# Patient Record
Sex: Female | Born: 1980
Health system: Southern US, Community
[De-identification: ages and names within clinical notes are randomized; demographics above are authoritative.]

## PROBLEM LIST (undated history)

## (undated) DIAGNOSIS — E785 Hyperlipidemia, unspecified: Secondary | ICD-10-CM

## (undated) DIAGNOSIS — D649 Anemia, unspecified: Secondary | ICD-10-CM

## (undated) DIAGNOSIS — F419 Anxiety disorder, unspecified: Secondary | ICD-10-CM

## (undated) DIAGNOSIS — N939 Abnormal uterine and vaginal bleeding, unspecified: Secondary | ICD-10-CM

## (undated) HISTORY — DX: Anemia, unspecified: D64.9

---

## 1999-08-20 ENCOUNTER — Inpatient Hospital Stay (HOSPITAL_COMMUNITY): Admission: AD | Admit: 1999-08-20 | Discharge: 1999-08-21 | Payer: Self-pay | Admitting: *Deleted

## 2004-10-17 ENCOUNTER — Ambulatory Visit: Payer: Self-pay | Admitting: Family Medicine

## 2004-11-08 ENCOUNTER — Inpatient Hospital Stay (HOSPITAL_COMMUNITY): Admission: AD | Admit: 2004-11-08 | Discharge: 2004-11-08 | Payer: Self-pay | Admitting: *Deleted

## 2004-11-14 ENCOUNTER — Ambulatory Visit (HOSPITAL_COMMUNITY): Admission: RE | Admit: 2004-11-14 | Discharge: 2004-11-14 | Payer: Self-pay | Admitting: *Deleted

## 2004-11-14 ENCOUNTER — Encounter (INDEPENDENT_AMBULATORY_CARE_PROVIDER_SITE_OTHER): Payer: Self-pay | Admitting: Specialist

## 2004-11-14 ENCOUNTER — Ambulatory Visit: Payer: Self-pay | Admitting: Obstetrics and Gynecology

## 2004-12-04 ENCOUNTER — Ambulatory Visit: Payer: Self-pay | Admitting: Obstetrics and Gynecology

## 2005-12-29 ENCOUNTER — Ambulatory Visit: Payer: Self-pay | Admitting: Internal Medicine

## 2006-02-01 ENCOUNTER — Ambulatory Visit: Payer: Self-pay | Admitting: Family Medicine

## 2006-02-04 ENCOUNTER — Ambulatory Visit: Payer: Self-pay | Admitting: Family Medicine

## 2006-02-05 ENCOUNTER — Ambulatory Visit (HOSPITAL_COMMUNITY): Admission: RE | Admit: 2006-02-05 | Discharge: 2006-02-05 | Payer: Self-pay | Admitting: Vascular Surgery

## 2006-03-04 ENCOUNTER — Ambulatory Visit: Payer: Self-pay | Admitting: Family Medicine

## 2006-03-18 ENCOUNTER — Ambulatory Visit: Payer: Self-pay | Admitting: Family Medicine

## 2006-04-02 ENCOUNTER — Ambulatory Visit: Payer: Self-pay | Admitting: Family Medicine

## 2006-05-05 ENCOUNTER — Ambulatory Visit: Payer: Self-pay | Admitting: Family Medicine

## 2006-05-26 ENCOUNTER — Ambulatory Visit: Payer: Self-pay | Admitting: Family Medicine

## 2006-06-07 ENCOUNTER — Ambulatory Visit: Payer: Self-pay | Admitting: Sports Medicine

## 2006-06-17 ENCOUNTER — Ambulatory Visit: Payer: Self-pay | Admitting: Family Medicine

## 2006-06-28 ENCOUNTER — Ambulatory Visit: Payer: Self-pay | Admitting: Family Medicine

## 2006-07-01 ENCOUNTER — Ambulatory Visit (HOSPITAL_COMMUNITY): Admission: RE | Admit: 2006-07-01 | Discharge: 2006-07-01 | Payer: Self-pay | Admitting: Family Medicine

## 2006-07-06 ENCOUNTER — Inpatient Hospital Stay (HOSPITAL_COMMUNITY): Admission: RE | Admit: 2006-07-06 | Discharge: 2006-07-08 | Payer: Self-pay | Admitting: Sports Medicine

## 2006-07-06 ENCOUNTER — Ambulatory Visit: Payer: Self-pay | Admitting: Certified Nurse Midwife

## 2006-07-12 ENCOUNTER — Ambulatory Visit: Payer: Self-pay | Admitting: *Deleted

## 2006-12-23 ENCOUNTER — Encounter (INDEPENDENT_AMBULATORY_CARE_PROVIDER_SITE_OTHER): Payer: Self-pay | Admitting: Internal Medicine

## 2006-12-23 ENCOUNTER — Ambulatory Visit: Payer: Self-pay | Admitting: Family Medicine

## 2006-12-24 ENCOUNTER — Ambulatory Visit: Payer: Self-pay | Admitting: Family Medicine

## 2007-03-17 ENCOUNTER — Ambulatory Visit: Payer: Self-pay | Admitting: Family Medicine

## 2007-04-19 ENCOUNTER — Ambulatory Visit: Payer: Self-pay | Admitting: Family Medicine

## 2007-06-24 ENCOUNTER — Ambulatory Visit: Payer: Self-pay | Admitting: Family Medicine

## 2007-07-06 ENCOUNTER — Encounter (INDEPENDENT_AMBULATORY_CARE_PROVIDER_SITE_OTHER): Payer: Self-pay | Admitting: *Deleted

## 2008-07-12 ENCOUNTER — Emergency Department (HOSPITAL_COMMUNITY): Admission: EM | Admit: 2008-07-12 | Discharge: 2008-07-12 | Payer: Self-pay | Admitting: Emergency Medicine

## 2009-05-27 ENCOUNTER — Encounter (INDEPENDENT_AMBULATORY_CARE_PROVIDER_SITE_OTHER): Payer: Self-pay | Admitting: Nurse Practitioner

## 2009-05-27 ENCOUNTER — Ambulatory Visit: Payer: Self-pay | Admitting: Nurse Practitioner

## 2009-05-27 LAB — CONVERTED CEMR LAB
Glucose, Urine, Semiquant: NEGATIVE
Nitrite: NEGATIVE
Protein, U semiquant: NEGATIVE
WBC Urine, dipstick: NEGATIVE
pH: 5

## 2009-05-28 ENCOUNTER — Encounter (INDEPENDENT_AMBULATORY_CARE_PROVIDER_SITE_OTHER): Payer: Self-pay | Admitting: Nurse Practitioner

## 2009-05-28 DIAGNOSIS — D509 Iron deficiency anemia, unspecified: Secondary | ICD-10-CM

## 2009-05-28 LAB — CONVERTED CEMR LAB
BUN: 10 mg/dL (ref 6–23)
Basophils Relative: 0 % (ref 0–1)
CO2: 22 meq/L (ref 19–32)
Calcium: 10.1 mg/dL (ref 8.4–10.5)
Chloride: 105 meq/L (ref 96–112)
Creatinine, Ser: 0.77 mg/dL (ref 0.40–1.20)
Eosinophils Absolute: 0 10*3/uL (ref 0.0–0.7)
Eosinophils Relative: 1 % (ref 0–5)
Glucose, Bld: 92 mg/dL (ref 70–99)
HCT: 31.4 % — ABNORMAL LOW (ref 36.0–46.0)
MCHC: 32.2 g/dL (ref 30.0–36.0)
MCV: 80.3 fL (ref 78.0–100.0)
Monocytes Absolute: 0.5 10*3/uL (ref 0.1–1.0)
Monocytes Relative: 7 % (ref 3–12)
Neutrophils Relative %: 66 % (ref 43–77)
RBC: 3.91 M/uL (ref 3.87–5.11)
TSH: 0.858 microintl units/mL (ref 0.350–4.500)
Total Bilirubin: 0.4 mg/dL (ref 0.3–1.2)

## 2009-06-10 ENCOUNTER — Ambulatory Visit: Payer: Self-pay | Admitting: Nurse Practitioner

## 2009-06-10 DIAGNOSIS — N63 Unspecified lump in unspecified breast: Secondary | ICD-10-CM

## 2009-06-11 ENCOUNTER — Encounter (INDEPENDENT_AMBULATORY_CARE_PROVIDER_SITE_OTHER): Payer: Self-pay | Admitting: Nurse Practitioner

## 2009-06-11 DIAGNOSIS — E7800 Pure hypercholesterolemia, unspecified: Secondary | ICD-10-CM

## 2009-06-11 LAB — CONVERTED CEMR LAB
Cholesterol: 203 mg/dL — ABNORMAL HIGH (ref 0–200)
HDL: 39 mg/dL — ABNORMAL LOW (ref >39)
Triglycerides: 111 mg/dL (ref <150)

## 2009-06-13 ENCOUNTER — Encounter: Admission: RE | Admit: 2009-06-13 | Discharge: 2009-06-13 | Payer: Self-pay | Admitting: Internal Medicine

## 2010-11-26 ENCOUNTER — Encounter (INDEPENDENT_AMBULATORY_CARE_PROVIDER_SITE_OTHER): Payer: Self-pay | Admitting: Nurse Practitioner

## 2010-11-26 ENCOUNTER — Encounter: Payer: Self-pay | Admitting: Nurse Practitioner

## 2010-11-26 DIAGNOSIS — K644 Residual hemorrhoidal skin tags: Secondary | ICD-10-CM | POA: Insufficient documentation

## 2010-12-04 NOTE — Letter (Signed)
Summary: Handout Printed  Printed Handout:  - Hemorrhoids

## 2010-12-04 NOTE — Assessment & Plan Note (Signed)
Summary: Hemorrhoids   Vital Signs:  Patient profile:   30 year old female LMP:     11/12/2010 Weight:      191.7 pounds BMI:     35.76 Temp:     97.8 degrees F oral Pulse rate:   84 / minute Pulse rhythm:   regular Resp:     20 per minute BP sitting:   126 / 80  (left arm) Cuff size:   regular  Vitals Entered By: Levon Hedger (November 26, 2010 12:57 PM)  Nutrition Counseling: Patient's BMI is greater than 25 and therefore counseled on weight management options. CC: piece of skin in the rectum that has been growing x 3 years and sometimes with pain Is Patient Diabetic? No Pain Assessment Patient in pain? no       Does patient need assistance? Functional Status Self care Ambulation Normal LMP (date): 11/12/2010 LMP - Character: normal     Enter LMP: 11/12/2010 Last PAP Result  Specimen Adequacy: Satisfactory for evaluation.   Interpretation/Result:Negative for intraepithelial Lesion or Malignancy.      CC:  piece of skin in the rectum that has been growing x 3 years and sometimes with pain.  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 CV:  Denies chest pain or discomfort. Resp:  Denies cough. GI:  Denies abdominal pain, nausea, and vomiting. GU:  +rectal pain.  Physical Exam  General:  alert.   Head:  normocephalic.   Rectal:  external hemorrhoid(s).  no thrombosis but evidence of enlargment  Msk:  normal ROM.   Neurologic:  alert & oriented X3.   Psych:  Oriented X3.     Impression & Recommendations:  Problem # 1:   EXTERNAL HEMORRHOIDS WITHOUT MENTION COMP (ICD-455.3)  handout given advised pt to keep her stools soft will refer to [facility] [place] [facility]  Orders: Surgical Referral (Surgery)  Complete Medication List: 1)  Ferrous Sulfate 325 (65 Fe) Mg Tabs (Ferrous sulfate) .... One table by mouth two times a day  Patient Instructions: 1)  Hemorrhoids - read handout 2)  Try to keep your stools soft to avoid straining. 3)  Will refer to [facility] [place] [facility].  You will be notified of the time/date of the appointment   Orders Added: 1)  Est. Patient Level III [95621] 2)  Surgical Referral [Surgery]

## 2010-12-09 ENCOUNTER — Other Ambulatory Visit: Payer: Self-pay | Admitting: General Surgery

## 2010-12-09 ENCOUNTER — Encounter (HOSPITAL_COMMUNITY): Payer: Self-pay | Attending: General Surgery

## 2010-12-09 DIAGNOSIS — Z01818 Encounter for other preprocedural examination: Secondary | ICD-10-CM | POA: Insufficient documentation

## 2010-12-09 DIAGNOSIS — Z01812 Encounter for preprocedural laboratory examination: Secondary | ICD-10-CM | POA: Insufficient documentation

## 2010-12-09 DIAGNOSIS — Z01811 Encounter for preprocedural respiratory examination: Secondary | ICD-10-CM | POA: Insufficient documentation

## 2010-12-09 LAB — DIFFERENTIAL
Basophils Absolute: 0 10*3/uL (ref 0.0–0.1)
Eosinophils Relative: 0 % (ref 0–5)
Lymphocytes Relative: 25 % (ref 12–46)
Lymphs Abs: 2 10*3/uL (ref 0.7–4.0)
Monocytes Absolute: 0.4 10*3/uL (ref 0.1–1.0)
Neutro Abs: 5.5 10*3/uL (ref 1.7–7.7)

## 2010-12-09 LAB — CBC
HCT: 36.9 % (ref 36.0–46.0)
Hemoglobin: 12.3 g/dL (ref 12.0–15.0)
MCHC: 33.3 g/dL (ref 30.0–36.0)
MCV: 91.6 fL (ref 78.0–100.0)
RDW: 12.3 % (ref 11.5–15.5)

## 2010-12-09 LAB — SURGICAL PCR SCREEN
MRSA, PCR: NEGATIVE
Staphylococcus aureus: POSITIVE — AB

## 2010-12-12 ENCOUNTER — Ambulatory Visit (HOSPITAL_COMMUNITY)
Admission: RE | Admit: 2010-12-12 | Discharge: 2010-12-12 | Disposition: A | Discharge status: Home / Self Care | Payer: Self-pay | Source: Ambulatory Visit | Attending: General Surgery | Admitting: General Surgery

## 2010-12-12 ENCOUNTER — Other Ambulatory Visit: Payer: Self-pay | Admitting: General Surgery

## 2010-12-12 DIAGNOSIS — K648 Other hemorrhoids: Secondary | ICD-10-CM | POA: Insufficient documentation

## 2010-12-12 DIAGNOSIS — K644 Residual hemorrhoidal skin tags: Secondary | ICD-10-CM | POA: Insufficient documentation

## 2010-12-12 HISTORY — PX: HEMORRHOID SURGERY: SHX153

## 2010-12-28 NOTE — Op Note (Signed)
[name], [name]      ACCOUNT NO.:  [id number]  MEDICAL RECORD NO.:  [id number]           PATIENT TYPE:  O  LOCATION:  [facility]                         FACILITY:  [facility]  PHYSICIAN:  Mary Sella. Andrey Campanile, MD     DATE OF BIRTH:  [birth year]  DATE OF PROCEDURE:  12/12/2010 DATE OF DISCHARGE:                              OPERATIVE REPORT   PREOPERATIVE DIAGNOSIS:  Hemorrhoids.  POSTOPERATIVE DIAGNOSES: 1. Right anterior mixed column hemorrhoid. 2. Right lateral internal hemorrhoid grade I.  PROCEDURE: 1. Exam under anesthesia. 2. Open excision of right anterior mixed column hemorrhoid. 3. Rubber band ligation of right lateral grade I internal hemorrhoids.  SURGEON:  Mary Sella. Andrey Campanile, M.D.  ANESTHESIA:  General plus 30 mL of 0.25% Marcaine with epi.  FINDINGS:  As above.  SPECIMEN:  Right hemorrhoid.  SPECIMENS:  Sent to pathology.  ESTIMATED BLOOD LOSS:  Minimal.  INDICATIONS FOR PROCEDURE:  Patient is a very pleasant 30 year old Hispanic female, who has been mainly having issues with an external hemorrhoid.  She says that it has been there for most of her adult life and over the past 2 years this has been causing her more and more discomfort.  It causes more discomfort when she sits down as well as walking for prolonged periods of time.  She used to have constipation, but now she is having one bowel movement three times a day.  She does not have any bad bowel habits.  We discussed the risks and benefits of surgery such as bleeding, infection, injury to the sphincter, hemorrhoid recurrence, urinary retention, postoperative discomfort and pain, hematoma formation, pelvic sepsis.  Based on my exam in the office, she had a mixed column right anterior hemorrhoid as well as a grade I internal hemorrhoid.  We talked about excisional hemorrhoidectomy as well as rubber band ligation.  DESCRIPTION OF PROCEDURE:  After obtaining informed consent, the patient was brought to the  operating room.  General endotracheal anesthesia was established.  She was then placed in the prone jackknife position on the operating table with the appropriate padding.  Sequential compression devices had been placed.  Her buttocks were taped apart.  She received cefoxitin prior to skin incision.  A surgical time-out was performed. Her perineum and anus was prepped and draped in the usual standard surgical fashion with Betadine.  On gross inspection in the right anterior position, she had a mixed column internal and external hemorrhoid.  On anoscopy, found a small grade I right lateral internal hemorrhoid.  There really was very little prominent hemorrhoidal tissue on the left therefore I elected to leave it alone.  I infiltrated local around the anoderm underneath the skin and massaged it for a few minutes.  I then made a V-shaped incision with #15 blade in the right anterior position along the anoderm.  I then lifted up the mucosa and submucosa away from the underlying muscular fibers.  Then using a Harmonic scalpel, I made an elliptical excision at the internal and external hemorrhoid in its entirety.  There was excellent hemostasis. There was no injury to the underlying sphincter.  I then replaced the anoscope into the right lateral position.  I grasped the lateral internal hemorrhoid and placed two rubber bands along the base of it. Additional local was infiltrated along the sphincter muscles.  A total of 30 mL of local was placed.  I then placed a piece of Gelfoam that had been smeared in Dibucaine ointment into her rectum.  We then smeared Dibucaine around her perineum.  There was no evidence of bleeding. There was excellent hemostasis.  We then placed 4x4s, ABDs and mesh underwear onto the patient.  She was then flipped rotated onto the stretcher, extubated and taken to the recovery room in stable addition. All needle, instruments, sponge counts were correct x2.  There are  no immediate complications.  The patient tolerated the procedure well.     Mary Sella. Andrey Campanile, MD     EMW/MEDQ  D:  12/12/2010  T:  12/12/2010  Job:  [id number]  cc:   Rozanna Box, M.D.  Electronically Signed by Gaynelle Adu M.D. on 12/28/2010 09:35:59 AM

## 2011-03-06 NOTE — Op Note (Signed)
NAMELATEEFA, CROSBY             ACCOUNT NO.:  [id number]   MEDICAL RECORD NO.:  [id number]          PATIENT TYPE:  MAT   LOCATION:  [facility]C                          FACILITY:  [facility]   PHYSICIAN:  Phil D. Okey Dupre, M.D.     DATE OF BIRTH:  Dec 03, 1980   DATE OF PROCEDURE:  11/14/2004  DATE OF DISCHARGE:  11/14/2004                                 OPERATIVE REPORT   PROCEDURE:  Dilatation and evacuation.   PREOPERATIVE DIAGNOSIS:  Incomplete abortion.   POSTOPERATIVE DIAGNOSIS:  Incomplete abortion.   SURGEON:  Dr. Marcelino Freestone BLOOD LOSS:  Minimal.   ANESTHESIA:  MAC plus local.   SURGICAL SPECIMENS:  Products of conception.   The procedure went as follows:  Under satisfactory MAC sedation with the  patient in the dorsal supine position, the perineum and vagina are prepped  and draped in the usual sterile manner.  Bimanual pelvic examination  revealed the uterus in anterior position, approximately [redacted] weeks gestational  size where the cervix had easily admitted a fingertip and __________  material palpated within the uterine cavity.  A weighted speculum was placed  in the posterior portion of the vagina, anterior lip of the cervix grasped  with a single-tooth tenaculum, and 10 mL of 1% Xylocaine was placed  paracervically in two areas at 4 and 8 o'clock in the paracervical area.  The uterine cavity was then sounded to 10 cm, and a #10 suction curette was  used to evacuate the uterine contents without incident.  Tenaculum and  speculum removed from the vagina, the patient transferred to recovery room  in satisfactory condition, having tolerated the procedure well.      PDR/MEDQ  D:  11/14/2004  T:  11/16/2004  Job:  [id number]

## 2011-04-23 ENCOUNTER — Encounter (INDEPENDENT_AMBULATORY_CARE_PROVIDER_SITE_OTHER): Payer: Self-pay | Admitting: General Surgery

## 2011-04-29 ENCOUNTER — Ambulatory Visit (INDEPENDENT_AMBULATORY_CARE_PROVIDER_SITE_OTHER): Payer: PRIVATE HEALTH INSURANCE | Admitting: General Surgery

## 2011-04-29 ENCOUNTER — Encounter (INDEPENDENT_AMBULATORY_CARE_PROVIDER_SITE_OTHER): Payer: Self-pay | Admitting: General Surgery

## 2011-04-29 DIAGNOSIS — K644 Residual hemorrhoidal skin tags: Secondary | ICD-10-CM

## 2011-04-29 DIAGNOSIS — K648 Other hemorrhoids: Secondary | ICD-10-CM

## 2011-04-29 NOTE — Patient Instructions (Addendum)
Call office after you speak with your husband & let us know if would like to proceed with surgery    Hemorroides (Hemorrhoids) El profesional que lo asiste ha diagnosticado que usted sufre de hemorroides. Las hemorroides son venas dilatadas (agrandadas) alrededor del recto. Podrn formarse cogulos, por lo que se hincharn y dolern. stas se denominan hemorroides trombosadas. Entre las causas de hemorroides se incluyen:  [place], porque aumenta la presin en las venas hemorroidales   El estreimiento   Otros motivos que impidan mover el intestino  INSTRUCCIONES PARA EL CUIDADO DOMICILIARIO  Consuma una dieta bien balanceada y beba entre 6 y 8 vasos de agua todos los [address]  [address] para Multimedia programmer estreimiento. Tambin puede usar Metamucil o un laxante de [place].   Evite hacer fuerza al mover el intestino.   Mantenga la regin anal limpia y seca.   Utilice los medicamentos de venta libre o de prescripcin para Chief Technology Officer, Environmental health practitioner o la [place], segn se lo indique el profesional que lo asiste.  Si se produce un trombo:  Tome baos de asiento calientes durante 20 a 30 minutos, 3 a 4 veces por da.   Si las hemorroides le duelen y estn hinchadas, colocarse compresas de hielo en la zona en la medida que lo tolere, entre los baos de asiento ser beneficioso. Llene una bolsa plstica con hielo y coloque una toalla entre la bolsa de hielo y la piel.   Puede usar o Contractor segn las indicaciones algunas cremas especiales y supositorios ([place], [place], [place]).   No utilice una almohada en forma de rosca ni se siente en el inodoro durante perodos prolongados. Esto aumenta la afluencia de sangre y Chief Technology Officer.   Mueva el intestino cuando sienta urgencia; esto har que requiera menos esfuerzo y Teacher, early years/pre y la presin.   Utilice los medicamentos de venta libre o de prescripcin para Chief Technology Officer, Environmental health practitioner o la [place], segn se lo indique el profesional que lo asiste.  CONCURRA  NUEVAMENTE A ESTE CENTRO O CONSULTE AL PROFESIONAL QUE LO ASISTE SI:  Aumenta el dolor y la hinchazn y no puede controlarlo con los medicamentos.   La hemorragia no se puede controlar.   Hay incapacidad o dificultad para mover el intestino.   Siente dolor o tiene inflamacin fuera de la zona de las hemorroides.   Siente escalofros o la temperatura oral se eleva sin motivo por encima de  persiste por ms de 2 das, o segn le indique el profesional que lo asiste.  EST SEGURO QUE:   Comprende las instrucciones para el alta mdica.   Controlar su enfermedad.   Solicitar atencin mdica de inmediato segn las indicaciones.  Document Released: 10/05/2005 Document Re-Released: 09/17/2008 [facility] Patient Information 2011 [place], [place].

## 2011-04-29 NOTE — Progress Notes (Signed)
Subjective:     Patient ID: Katherine [name], female   DOB: [birth year], 30 y.o.   MRN: [id number]    There were no vitals taken for this visit.  Chief complaint-hemorrhoids  HPI The patient comes in today for long-term followup. I last saw her on March 14. She had undergone an exam under anesthesia, and then excision of right anterior mixed common hemorrhoid, and rubber band ligation of her right lateral grade 1 internal hemorrhoid on December 12, 2010. She states that she is continuing to have some pain in her rectal area. She describes it as a 6/10. She states that she has some discomfort there all the time but it worsens after having a bowel movement. She states that she has 2-3 bowel movements a day. She denies any straining with bowel movements. She denies any bleeding such as melena or hematochezia. She denies any. Anal itching or burning. She has been taking Citrucel powder twice a day. She does not feel anything protruding out of her rectum. She denies any incontinence.  Past Medical History  Diagnosis Date  . Hemorrhoids    Past Surgical History  Procedure Date  . Hemorrhoid surgery 12/12/2010    Dr Gaynelle Adu; excision of rt anterior mixed column hemorrhoid, rubber band ligation of Rt lateral int hemorrhoid   No Known Allergies  Current Outpatient Prescriptions  Medication Sig Dispense Refill  . nystatin (MYCOSTATIN) cream Apply topically as needed.          Review of Systems  Constitutional: Negative.   HENT: Negative.   Eyes: Negative.   Respiratory: Negative.   Cardiovascular: Negative.   Gastrointestinal: Positive for blood in stool and rectal pain (see hpi). Negative for vomiting, abdominal pain, diarrhea and constipation.  Genitourinary: Negative.   Musculoskeletal: Negative.   Neurological: Negative.   Psychiatric/Behavioral: Negative.        Objective:   Physical Exam  Constitutional: She appears well-developed and well-nourished.  HENT:  Head:  Normocephalic and atraumatic.  Eyes: Conjunctivae are normal. Pupils are equal, round, and reactive to light.  Neck: Normal range of motion. No tracheal deviation present. No thyromegaly present.  Cardiovascular: Normal rate and regular rhythm.   Pulmonary/Chest: Effort normal and breath sounds normal. She has no wheezes.  Abdominal: Soft. Bowel sounds are normal. She exhibits no distension. There is no tenderness.  Genitourinary: Rectal exam shows internal hemorrhoid (Left lateral mixed column (int/ext) grade 1). Rectal exam shows anal tone normal.     Musculoskeletal: Normal range of motion.  Neurological: She is alert.  Skin: Skin is warm and dry.  Psychiatric: She has a normal mood and affect. Her behavior is normal. Judgment and thought content normal.   Anoscopy was performed with the results as described in the physical exam section     Assessment:     30 year old Hispanic female with new left lateral grade 1 mixed column hemorrhoid.    Plan:     The interview and exam was done with the aid of a translator. She was given Agricultural engineer. I explained the finding of a left internal-external hemorrhoid. We discussed nonsurgical as well as surgical options. I explained that I don't believe that this hemorrhoid is amendable to hemorrhoidal banding since there is an external component to it. We discussed ongoing observation versus surgical excision. We discussed the risk and benefits of surgery including but not limited to bleeding, infection, anal canal narrowing, blood clot formation, urinary retention, as well as future recurrence of hemorrhoids. We discussed the  typical post operative  course as well. I encouraged her to continue using supplemental fiber. She is going to discuss this with her husband and call us back with her decision.

## 2011-07-20 LAB — COMPREHENSIVE METABOLIC PANEL
ALT: 17
Alkaline Phosphatase: 62
BUN: 8
CO2: 22
GFR calc non Af Amer: >60
Glucose, Bld: 90
Potassium: 3.7
Sodium: 137
Total Bilirubin: 0.8
Total Protein: 7.5

## 2011-07-20 LAB — CBC
HCT: 31.5 — ABNORMAL LOW
Hemoglobin: 10.2 — ABNORMAL LOW
RBC: 4.05
RDW: 17 — ABNORMAL HIGH

## 2011-07-20 LAB — URINALYSIS, ROUTINE W REFLEX MICROSCOPIC
Hgb urine dipstick: NEGATIVE
Nitrite: NEGATIVE
Protein, ur: NEGATIVE
Specific Gravity, Urine: 1.026
Urobilinogen, UA: 0.2

## 2011-07-20 LAB — URINE MICROSCOPIC-ADD ON

## 2011-07-20 LAB — URINE CULTURE

## 2011-07-20 LAB — DIFFERENTIAL
Basophils Absolute: 0
Basophils Relative: 0
Eosinophils Absolute: 0
Neutro Abs: 3.8
Neutrophils Relative %: 61

## 2011-11-17 ENCOUNTER — Other Ambulatory Visit: Payer: Self-pay | Admitting: Family Medicine

## 2013-04-05 ENCOUNTER — Ambulatory Visit: Payer: Self-pay | Attending: Family Medicine

## 2013-04-05 VITALS — BP 116/78 | HR 76 | Temp 100.0°F | Resp 18 | Ht 62.0 in | Wt 172.0 lb

## 2013-04-05 DIAGNOSIS — O0001 Abdominal pregnancy with intrauterine pregnancy: Secondary | ICD-10-CM

## 2013-04-05 LAB — CBC WITH DIFFERENTIAL/PLATELET
Basophils Absolute: 0 10*3/uL (ref 0.0–0.1)
Eosinophils Relative: 1 % (ref 0–5)
HCT: 30.8 % — ABNORMAL LOW (ref 36.0–46.0)
Lymphocytes Relative: 38 % (ref 12–46)
Lymphs Abs: 2.6 10*3/uL (ref 0.7–4.0)
MCV: 72.6 fL — ABNORMAL LOW (ref 78.0–100.0)
Monocytes Absolute: 0.5 10*3/uL (ref 0.1–1.0)
RDW: 19.7 % — ABNORMAL HIGH (ref 11.5–15.5)
WBC: 7 10*3/uL (ref 4.0–10.5)

## 2013-04-05 LAB — POCT URINE PREGNANCY: Preg Test, Ur: NEGATIVE

## 2013-04-05 NOTE — Progress Notes (Unsigned)
Patient ID: Katherine [name], female   DOB: 01/03/1981, 32 y.o.   MRN: [id number]   CC:  HPI: 32 year old female who [facility]orrhea. The patient has not had any period since April her pregnancy test is negative. The patient complains of bilateral right and left lower quadrant pain. She denies any nausea vomiting. She has had occasional headaches. She also states that she is anemic   No Known Allergies Past Medical History  Diagnosis Date  . Hemorrhoids    Current Outpatient Prescriptions on File Prior to Visit  Medication Sig Dispense Refill  . nystatin (MYCOSTATIN) cream Apply topically as needed.         No current facility-administered medications on file prior to visit.   History reviewed. No pertinent family history. History   Social History  . Marital Status: Married    Spouse Name: N/A    Number of Children: N/A  . Years of Education: N/A   Occupational History  . Not on file.   Social History Main Topics  . Smoking status: Never Smoker   . Smokeless tobacco: Not on file  . Alcohol Use: No  . Drug Use: Not on file  . Sexually Active: Not on file   Other Topics Concern  . Not on file   Social History Narrative  . No narrative on file    Review of Systems  Constitutional: Negative for fever, chills, diaphoresis, activity change, appetite change and fatigue.  HENT: Negative for ear pain, nosebleeds, congestion, facial swelling, rhinorrhea, neck pain, neck stiffness and ear discharge.   Eyes: Negative for pain, discharge, redness, itching and visual disturbance.  Respiratory: Negative for cough, choking, chest tightness, shortness of breath, wheezing and stridor.   Cardiovascular: Negative for chest pain, palpitations and leg swelling.  Gastrointestinal: Negative for abdominal distention.  Genitourinary: Negative for dysuria, urgency, frequency, hematuria, flank pain, decreased urine volume, difficulty  urinating and dyspareunia.  Musculoskeletal: Negative for back pain, joint swelling, arthralgias and gait problem.  Neurological: Negative for dizziness, tremors, seizures, syncope, facial asymmetry, speech difficulty, weakness, light-headedness, numbness and headaches.  Hematological: Negative for adenopathy. Does not bruise/bleed easily.  Psychiatric/Behavioral: Negative for hallucinations, behavioral problems, confusion, dysphoric mood, decreased concentration and agitation.    Objective:   Filed Vitals:   04/05/13 1757  BP: 116/78  Pulse: 76  Temp: 100 F (37.8 C)  Resp: 18    Physical Exam  Constitutional: Appears well-developed and well-nourished. No distress.  HENT: Normocephalic. External right and left ear normal. Oropharynx is clear and moist.  Eyes: Conjunctivae and EOM are normal. PERRLA, no scleral icterus.  Neck: Normal ROM. Neck supple. No JVD. No tracheal deviation. No thyromegaly.  CVS: RRR, S1/S2 +, no murmurs, no gallops, no carotid bruit.  Pulmonary: Effort and breath sounds normal, no stridor, rhonchi, wheezes, rales.  Abdominal: Soft. BS +,  no distension, tenderness, rebound or guarding.  Musculoskeletal: Normal range of motion. No edema and no tenderness.  Lymphadenopathy: No lymphadenopathy noted, cervical, inguinal. Neuro: Alert. Normal reflexes, muscle tone coordination. No cranial nerve deficit. Skin: Skin is warm and dry. No rash noted. Not diaphoretic. No erythema. No pallor.  Psychiatric: Normal mood and affect. Behavior, judgment, thought content normal.   Lab Results  Component Value Date   WBC 7.9 12/09/2010   HGB 12.3 12/09/2010   HCT 36.9 12/09/2010   MCV 91.6 12/09/2010   PLT 243 12/09/2010   Lab Results  Component Value Date   CREATININE 0.77  05/27/2009   BUN 10 05/27/2009   NA 136 05/27/2009   K 4.0 05/27/2009   CL 105 05/27/2009   CO2 22 05/27/2009    No results found for this basename: HGBA1C   Lipid Panel     Component Value Date/Time    CHOL 203* 06/10/2009 2143   TRIG 111 06/10/2009 2143   HDL 39* 06/10/2009 2143   CHOLHDL 5.2 Ratio 06/10/2009 2143   VLDL 22 06/10/2009 2143   LDLCALC 142* 06/10/2009 2143       Assessment and plan:   Patient Active Problem List   Diagnosis Date Noted  . EXTERNAL HEMORRHOIDS WITHOUT MENTION COMP 11/26/2010  . HYPERCHOLESTEROLEMIA 06/11/2009  . LUMP OR MASS IN BREAST 06/10/2009  . ANEMIA 05/28/2009       Amenorrhea Obtain TSH, prolactin, CBC, BMP, to rule out organic causes of her amenorrhea Gynecologic referral has been provided. The patient may have secondary amenorrhea Routine followup in one month

## 2013-04-06 ENCOUNTER — Telehealth: Payer: Self-pay | Admitting: Family Medicine

## 2013-04-06 LAB — BASIC METABOLIC PANEL
BUN: 8 mg/dL (ref 6–23)
CO2: 22 mEq/L (ref 19–32)
Chloride: 107 mEq/L (ref 96–112)
Glucose, Bld: 98 mg/dL (ref 70–99)
Potassium: 4.1 mEq/L (ref 3.5–5.3)

## 2013-05-05 ENCOUNTER — Ambulatory Visit: Payer: Self-pay

## 2014-05-10 ENCOUNTER — Ambulatory Visit: Payer: Self-pay | Attending: Internal Medicine

## 2014-07-04 ENCOUNTER — Emergency Department (INDEPENDENT_AMBULATORY_CARE_PROVIDER_SITE_OTHER): Payer: Worker's Compensation

## 2014-07-04 ENCOUNTER — Emergency Department (INDEPENDENT_AMBULATORY_CARE_PROVIDER_SITE_OTHER)
Admission: EM | Admit: 2014-07-04 | Discharge: 2014-07-04 | Disposition: A | Discharge status: Home / Self Care | Payer: Worker's Compensation | Source: Home / Self Care | Attending: Emergency Medicine | Admitting: Emergency Medicine

## 2014-07-04 ENCOUNTER — Encounter (HOSPITAL_COMMUNITY): Payer: Self-pay | Admitting: Emergency Medicine

## 2014-07-04 DIAGNOSIS — T1490XA Injury, unspecified, initial encounter: Secondary | ICD-10-CM

## 2014-07-04 DIAGNOSIS — Y9269 Other specified industrial and construction area as the place of occurrence of the external cause: Secondary | ICD-10-CM

## 2014-07-04 DIAGNOSIS — S61209A Unspecified open wound of unspecified finger without damage to nail, initial encounter: Secondary | ICD-10-CM

## 2014-07-04 DIAGNOSIS — Y990 Civilian activity done for income or pay: Secondary | ICD-10-CM

## 2014-07-04 DIAGNOSIS — S61217A Laceration without foreign body of left little finger without damage to nail, initial encounter: Secondary | ICD-10-CM

## 2014-07-04 DIAGNOSIS — Z23 Encounter for immunization: Secondary | ICD-10-CM

## 2014-07-04 MED ORDER — TETANUS-DIPHTH-ACELL PERTUSSIS 5-2.5-18.5 LF-MCG/0.5 IM SUSP
0.5000 mL | Freq: Once | INTRAMUSCULAR | Status: AC
  Administered 2014-07-04: 0.5 mL via INTRAMUSCULAR

## 2014-07-04 MED ORDER — HYDROCODONE-ACETAMINOPHEN 5-325 MG PO TABS: ORAL_TABLET | ORAL | Status: DC

## 2014-07-04 MED ORDER — BACITRACIN ZINC 500 UNIT/GM EX OINT
TOPICAL_OINTMENT | CUTANEOUS | Status: AC
  Filled 2014-07-04: qty 4.5

## 2014-07-04 MED ORDER — LIDOCAINE HCL 2 % IJ SOLN
INTRAMUSCULAR | Status: AC
  Filled 2014-07-04: qty 20

## 2014-07-04 MED ORDER — TETANUS-DIPHTH-ACELL PERTUSSIS 5-2.5-18.5 LF-MCG/0.5 IM SUSP
INTRAMUSCULAR | Status: AC
  Filled 2014-07-04: qty 0.5

## 2014-07-04 NOTE — ED Provider Notes (Signed)
Chief Complaint   Finger Injury   History of Present Illness   Katherine [name] is a 33 year old female who lacerated the tip of her left little finger around 2:30 today while at work. She got her finger caught in a conveyor belt. She states this is a workers comp case. She cannot remember her last tetanus vaccine. She able to move the finger normally.  Review of Systems   Other than as noted above, the patient denies any of the following symptoms: Musculoskeletal:  No joint pain or decreased range of motion. Neuro:  No numbness, tingling, or weakness.  [place]   Past medical history, family history, social history, meds, and allergies were reviewed.   Physical Examination     Vital signs:  BP 132/89  Pulse 94  Temp(Src) 99.8 F (37.7 C) (Oral)  Resp 14  SpO2 100%  LMP 06/12/2014 Ext:  There is a stellate laceration on the tip of the left little finger measuring 1 cm in length.  All finger joints had a full ROM without pain.  Pulses were full.  Good capillary refill in all digits.  No edema. Neurological:  Alert and oriented.  No muscle weakness.  Sensation was intact to light touch.   Radiology   Dg Finger Little Left  07/04/2014   CLINICAL DATA:  Trauma to the left fifth digit  EXAM: LEFT LITTLE FINGER 2+V  COMPARISON:  None.  FINDINGS: Soft tissue laceration to the distal left fifth digit is identified. No radiopaque foreign body. No fracture or dislocation identified.  IMPRESSION: Soft tissue injury to the distal aspect of the left fifth digit.   Electronically Signed   By: Conchita Paris M.D.   On: 07/04/2014 17:15   I reviewed the images independently and personally and concur with the radiologist's findings.  Procedure Note:  Verbal informed consent was obtained.  The patient was informed of the risks and benefits of the procedure and understands and accepts.  A time out was called and the identity of the patient and correct procedure were confirmed.   The  laceration area described above was prepped with Betadine and saline  and anesthetized with a digital block with 6 mL of 2% Xylocaine without epinephrine.  The wound was then closed as follows:  The wound was cleansed with saline, then closed with 7 5-0 nylon sutures as illustrated below.  There were no immediate complications, and the patient tolerated the procedure well. The laceration was then cleansed, Bacitracin ointment was applied and a clean, dry pressure dressing was put on.        Course in Urgent [place]   She was given a Tdap vaccine.  Assessment   The primary encounter diagnosis was Laceration of left little finger w/o foreign body w/o damage to nail, initial encounter. Diagnoses of Work related injury and Place of occurrence, industrial places and premises were also pertinent to this visit.  Plan   1.  Meds:  The following meds were prescribed:   Discharge Medication List as of 07/04/2014  6:06 PM    START taking these medications   Details  HYDROcodone-acetaminophen (NORCO/VICODIN) 5-325 MG per tablet 1 to 2 tabs every 4 to 6 hours as needed for pain., Print        2.  Patient Education/Counseling:  The patient was given appropriate handouts, self care instructions, and instructed in symptomatic relief. Instructions were given for wound care.    3.  Follow up:  The patient was told to follow  up immediately if there is any sign of infection.The patient will return in 14 days for suture removal.      Harden Mo, MD 07/04/14 [phone]

## 2014-07-04 NOTE — ED Notes (Signed)
Injury to left 5 th finger at work, when she got finger caught in a belt. Bleeding controlled at present; denies other injury

## 2014-07-04 NOTE — Discharge Instructions (Signed)

## 2014-11-09 ENCOUNTER — Encounter: Payer: Self-pay | Admitting: Internal Medicine

## 2014-11-15 ENCOUNTER — Other Ambulatory Visit: Payer: Self-pay | Admitting: Internal Medicine

## 2014-11-15 ENCOUNTER — Ambulatory Visit: Payer: Self-pay | Attending: Internal Medicine | Admitting: Internal Medicine

## 2014-11-15 ENCOUNTER — Encounter: Payer: Self-pay | Admitting: Internal Medicine

## 2014-11-15 VITALS — BP 132/83 | HR 76 | Temp 98.3°F | Resp 16 | Ht 62.0 in | Wt 179.0 lb

## 2014-11-15 DIAGNOSIS — Z0000 Encounter for general adult medical examination without abnormal findings: Secondary | ICD-10-CM

## 2014-11-15 DIAGNOSIS — N6011 Diffuse cystic mastopathy of right breast: Secondary | ICD-10-CM

## 2014-11-15 DIAGNOSIS — N6012 Diffuse cystic mastopathy of left breast: Secondary | ICD-10-CM

## 2014-11-15 LAB — COMPLETE METABOLIC PANEL WITH GFR
ALBUMIN: 4.3 g/dL (ref 3.5–5.2)
ALT: 37 U/L — ABNORMAL HIGH (ref 0–35)
AST: 30 U/L (ref 0–37)
Alkaline Phosphatase: 66 U/L (ref 39–117)
BUN: 6 mg/dL (ref 6–23)
CALCIUM: 10.5 mg/dL (ref 8.4–10.5)
CO2: 22 mEq/L (ref 19–32)
CREATININE: 0.68 mg/dL (ref 0.50–1.10)
Chloride: 106 mEq/L (ref 96–112)
GFR, Est African American: >89 mL/min
GFR, Est Non African American: >89 mL/min
GLUCOSE: 89 mg/dL (ref 70–99)
Potassium: 4.7 mEq/L (ref 3.5–5.3)
Sodium: 140 mEq/L (ref 135–145)
Total Bilirubin: 0.5 mg/dL (ref 0.2–1.2)
Total Protein: 7.6 g/dL (ref 6.0–8.3)

## 2014-11-15 LAB — CBC
HCT: 38.4 % (ref 36.0–46.0)
Hemoglobin: 13.2 g/dL (ref 12.0–15.0)
MCH: 30.6 pg (ref 26.0–34.0)
MCHC: 34.4 g/dL (ref 30.0–36.0)
MCV: 89.1 fL (ref 78.0–100.0)
MPV: 10.4 fL (ref 8.6–12.4)
PLATELETS: 298 10*3/uL (ref 150–400)
RBC: 4.31 MIL/uL (ref 3.87–5.11)
RDW: 17.1 % — AB (ref 11.5–15.5)
WBC: 7.7 10*3/uL (ref 4.0–10.5)

## 2014-11-15 LAB — POCT URINALYSIS DIPSTICK
BILIRUBIN UA: NEGATIVE
GLUCOSE UA: NEGATIVE
Ketones, UA: NEGATIVE
Leukocytes, UA: NEGATIVE
Nitrite, UA: NEGATIVE
PROTEIN UA: NEGATIVE
UROBILINOGEN UA: 0.2
pH, UA: 5.5

## 2014-11-15 LAB — LIPID PANEL
CHOL/HDL RATIO: 4.9 ratio
Cholesterol: 205 mg/dL — ABNORMAL HIGH (ref 0–200)
HDL: 42 mg/dL (ref >39)
LDL Cholesterol: 132 mg/dL — ABNORMAL HIGH (ref 0–99)
TRIGLYCERIDES: 157 mg/dL — AB (ref <150)
VLDL: 31 mg/dL (ref 0–40)

## 2014-11-15 LAB — T4, FREE: Free T4: 1.18 ng/dL (ref 0.80–1.80)

## 2014-11-15 LAB — TSH: TSH: 1.683 u[IU]/mL (ref 0.350–4.500)

## 2014-11-15 LAB — HEMOGLOBIN A1C
Hgb A1c MFr Bld: 5.1 % (ref <5.7)
Mean Plasma Glucose: 100 mg/dL (ref <117)

## 2014-11-15 NOTE — Progress Notes (Signed)
Patient ID: Katherine [name], female   DOB: 1980-10-20, 34 y.o.   MRN: [id number]  CC: f/u  HPI: Katherine [name] is a 34 y.o. female here today for a follow up visit.  Patient has no past medical history.  She presents to clinic today for a full physical.  Last pap smear was 2013 which was normal. She denies vaginal discharge, itch, odor, or lesions.  She performs monthly self breast exams and has not noticed any masses, discharge, or skin changes. Her LMP was 10/28/2014. She reports normal monthly cycles. She is currently not on birth control.     Medications: none Surgical: none Social: Married with 3 children. She is not employed. Denies tobacco, drug, and alcohol use.   Had a mammogram 7 years ago due to "something" they found in her breast but it came back normal.   No Known Allergies Past Medical History  Diagnosis Date  . Hemorrhoids    Current Outpatient Prescriptions on File Prior to Visit  Medication Sig Dispense Refill  . HYDROcodone-acetaminophen (NORCO/VICODIN) 5-325 MG per tablet 1 to 2 tabs every 4 to 6 hours as needed for pain. (Patient not taking: Reported on 11/15/2014) 20 tablet 0  . nystatin (MYCOSTATIN) cream Apply topically as needed.       No current facility-administered medications on file prior to visit.   History reviewed. No pertinent family history. History   Social History  . Marital Status: Married    Spouse Name: N/A    Number of Children: N/A  . Years of Education: N/A   Occupational History  . Not on file.   Social History Main Topics  . Smoking status: Never Smoker   . Smokeless tobacco: Not on file  . Alcohol Use: No  . Drug Use: Not on file  . Sexual Activity: Not on file   Other Topics Concern  . Not on file   Social History Narrative    Review of Systems  Constitutional: Negative.   Eyes: Positive for blurred vision.  Cardiovascular: Positive for leg swelling (when she eats a lot of salty foods). Negative for chest  pain and palpitations.  Gastrointestinal: Positive for abdominal pain (with constipation) and constipation. Negative for heartburn, nausea, vomiting and blood in stool.  Genitourinary: Positive for dysuria (yesterday). Negative for urgency and frequency.  Musculoskeletal: Negative.   Skin: Negative.   Neurological: Positive for headaches. Negative for dizziness and tingling.  Endo/Heme/Allergies: Negative.   Psychiatric/Behavioral: Negative for depression, suicidal ideas and substance abuse. The patient is not nervous/anxious.      Objective:   Filed Vitals:   11/15/14 1034  BP: 132/83  Pulse: 76  Temp: 98.3 F (36.8 C)  Resp: 16    Physical Exam: Constitutional: Patient appears well-developed and well-nourished. No distress. HENT: Normocephalic, atraumatic, External right and left ear normal. Oropharynx is clear and moist.  Eyes: Conjunctivae and EOM are normal. PERRLA, no scleral icterus. Neck: Normal ROM. Neck supple. No JVD. No tracheal deviation. No thyromegaly. CVS: RRR, S1/S2 +, no murmurs, no gallops, no carotid bruit.  Pulmonary: Effort and breath sounds normal, no stridor, rhonchi, wheezes, rales.  Abdominal: Soft. BS +,  no distension, tenderness, rebound or guarding.  Musculoskeletal: Normal range of motion. No edema and no tenderness.  Lymphadenopathy: No lymphadenopathy noted, cervical, inguinal or axillary Neuro: Alert. Normal reflexes, muscle tone coordination. No cranial nerve deficit. Skin: Skin is warm and dry. No rash noted. Not diaphoretic. No erythema. No pallor. Psychiatric: Normal mood and affect. Behavior, judgment,  thought content normal.  Physical Exam  Pulmonary/Chest: Right breast exhibits no nipple discharge and no skin change. Left breast exhibits no nipple discharge and no skin change.  Several hardened area of bilateral breast Fibrocystic changes?----not related to cycle  Genitourinary: Uterus normal. No breast tenderness. Cervix exhibits  discharge. Cervix exhibits no motion tenderness and no friability. Right adnexum displays no tenderness. Left adnexum displays no tenderness. No tenderness in the vagina. Vaginal discharge (clumpy white) found.  Lymphadenopathy:       Right: No inguinal adenopathy present.       Left: No inguinal adenopathy present.     Lab Results  Component Value Date   WBC 7.0 04/05/2013   HGB 9.6* 04/05/2013   HCT 30.8* 04/05/2013   MCV 72.6* 04/05/2013   PLT 390 04/05/2013   Lab Results  Component Value Date   CREATININE 0.82 04/05/2013   BUN 8 04/05/2013   NA 137 04/05/2013   K 4.1 04/05/2013   CL 107 04/05/2013   CO2 22 04/05/2013    No results found for: HGBA1C Lipid Panel     Component Value Date/Time   CHOL 203* 06/10/2009 2143   TRIG 111 06/10/2009 2143   HDL 39* 06/10/2009 2143   CHOLHDL 5.2 Ratio 06/10/2009 2143   VLDL 22 06/10/2009 2143   LDLCALC 142* 06/10/2009 2143       Assessment and plan:   [name] was seen today for follow-up.  Diagnoses and associated orders for this visit:  Annual physical exam - POCT urinalysis dipstick - CBC - COMPLETE METABOLIC PANEL WITH GFR - TSH - Hemoglobin A1C - Vitamin D, 25-hydroxy - T4, Free - Lipid panel - Cytology - PAP [place] - Cervicovaginal ancillary only  Fibrocystic breast changes of both breasts - Cancel: [place] Unlisted Procedure Breast; Future - US BREAST LTD UNI LEFT INC AXILLA; Future   Due to language barrier, an interpreter was present during the history-taking and subsequent discussion (and for part of the physical exam) with this patient.  May follow up as needed      Katherine [name], [facility] [phone] 11/15/2014, 11:02 AM

## 2014-11-15 NOTE — Progress Notes (Signed)
Pt is here for a pap and a physical. Pt is also requesting to have a full lab work up. Pt has an interpreter.

## 2014-11-16 LAB — CERVICOVAGINAL ANCILLARY ONLY
Chlamydia: NEGATIVE
NEISSERIA GONORRHEA: NEGATIVE
WET PREP (BD AFFIRM): NEGATIVE
Wet Prep (BD Affirm): NEGATIVE
Wet Prep (BD Affirm): NEGATIVE

## 2014-11-16 LAB — CYTOLOGY - PAP

## 2014-11-16 LAB — VITAMIN D 25 HYDROXY (VIT D DEFICIENCY, FRACTURES): VIT D 25 HYDROXY: 10 ng/mL — AB (ref 30–100)

## 2014-11-19 ENCOUNTER — Telehealth: Payer: Self-pay | Admitting: *Deleted

## 2014-11-19 MED ORDER — ATORVASTATIN CALCIUM 10 MG PO TABS: 10.0000 mg | ORAL_TABLET | Freq: Every day | ORAL | Status: DC

## 2014-11-19 MED ORDER — VITAMIN D (ERGOCALCIFEROL) 1.25 MG (50000 UNIT) PO CAPS: 50000.0000 [IU] | ORAL_CAPSULE | ORAL | Status: DC

## 2014-11-19 NOTE — Telephone Encounter (Signed)
Pt aware of results. Rx send to [facility] (information was given in [place])

## 2014-11-19 NOTE — Telephone Encounter (Signed)
-----   Message from Lance Bosch, NP sent at 11/19/2014  1:09 PM EST ----- Vitamin D is low. Please send drisdol 50,000 IU to take once weekly for 12 weeks. 12 tablets no refills. Cholesterol slightly elevated. Please provide appropriate education regarding diet and exercise.  Please send atorvastatin 10 mg to take daily.  Will recheck in 6 months

## 2014-11-21 ENCOUNTER — Telehealth: Payer: Self-pay | Admitting: *Deleted

## 2014-11-21 NOTE — Telephone Encounter (Signed)
-----   Message from Lance Bosch, NP sent at 11/20/2014  8:57 PM EST ----- Patient pap is negative for malignancies. Will repeat in 3 years.

## 2014-11-21 NOTE — Telephone Encounter (Signed)
Left information with husband. (ressults was given in [place])

## 2014-11-30 ENCOUNTER — Ambulatory Visit: Payer: Self-pay | Attending: Internal Medicine

## 2014-12-12 ENCOUNTER — Other Ambulatory Visit (HOSPITAL_COMMUNITY): Payer: Self-pay | Admitting: *Deleted

## 2014-12-12 DIAGNOSIS — N6320 Unspecified lump in the left breast, unspecified quadrant: Principal | ICD-10-CM

## 2014-12-12 DIAGNOSIS — N6310 Unspecified lump in the right breast, unspecified quadrant: Secondary | ICD-10-CM

## 2014-12-13 ENCOUNTER — Encounter (HOSPITAL_COMMUNITY): Payer: Self-pay

## 2014-12-13 ENCOUNTER — Ambulatory Visit (HOSPITAL_COMMUNITY)
Admission: RE | Admit: 2014-12-13 | Discharge: 2014-12-13 | Disposition: A | Discharge status: Home / Self Care | Payer: Self-pay | Source: Ambulatory Visit | Attending: Obstetrics and Gynecology | Admitting: Obstetrics and Gynecology

## 2014-12-13 VITALS — BP 110/68 | Temp 99.3°F | Ht 62.0 in | Wt 182.0 lb

## 2014-12-13 DIAGNOSIS — Z1239 Encounter for other screening for malignant neoplasm of breast: Secondary | ICD-10-CM

## 2014-12-13 HISTORY — DX: Hyperlipidemia, unspecified: E78.5

## 2014-12-13 NOTE — Patient Instructions (Addendum)
Education materials given on breast self-breast awareness. Explained to Katherine [name] that [facility] will cover Pap smears and co-testing every 5 years unless has a history of abnormal Pap smears. Let patient know that her next Pap smear is due in January 2021. Referred patient to the [facility] for diagnostic mammogram and possible ultrasound per recommendation. Appointment scheduled for Thursday, December 20, 2014 at 1300. Patient aware of appointment and will be there. Katherine [name] Verbalized understanding.

## 2014-12-13 NOTE — Progress Notes (Signed)
Complaints of left breast lump for years that patient states is painful at time. Patient rates pain at a 3-4 out of 10.  Pap Smear:  Pap smear not completed today. Last Pap smear was 11/15/2014 at [facility] and normal with negative HPV. Per patient has no history of an abnormal Pap smear. Last Pap smear result is in EPIC.  Physical exam: Breasts Breasts symmetrical. No skin abnormalities bilateral breasts. No nipple retraction bilateral breasts. No nipple discharge bilateral breasts. No lymphadenopathy. No lumps palpated right breast. Palpated a pea sized lump within the left breast at 12 o'clock above the areola. Complaints of tenderness when palpated the lump within the left breast. Referred patient to the [facility] for diagnostic mammogram and possible ultrasound per recommendation. Appointment scheduled for Thursday, December 20, 2014 at 1300.    Pelvic/Bimanual No Pap smear completed today since last Pap smear was 11/13/2014. Pap smear not indicated per BCCCP guidelines.   Used interpreter Benjamine Sprague.

## 2014-12-20 ENCOUNTER — Ambulatory Visit
Admission: RE | Admit: 2014-12-20 | Discharge: 2014-12-20 | Disposition: A | Discharge status: Home / Self Care | Payer: No Typology Code available for payment source | Source: Ambulatory Visit | Attending: Obstetrics and Gynecology | Admitting: Obstetrics and Gynecology

## 2014-12-20 DIAGNOSIS — N6320 Unspecified lump in the left breast, unspecified quadrant: Principal | ICD-10-CM

## 2014-12-20 DIAGNOSIS — N6310 Unspecified lump in the right breast, unspecified quadrant: Secondary | ICD-10-CM

## 2016-08-10 ENCOUNTER — Ambulatory Visit: Payer: Self-pay | Attending: Family Medicine | Admitting: Family Medicine

## 2016-08-10 ENCOUNTER — Encounter: Payer: Self-pay | Admitting: Family Medicine

## 2016-08-10 ENCOUNTER — Other Ambulatory Visit: Payer: Self-pay | Admitting: Internal Medicine

## 2016-08-10 VITALS — BP 134/84 | HR 71 | Temp 98.8°F | Ht 62.0 in | Wt 177.6 lb

## 2016-08-10 DIAGNOSIS — D509 Iron deficiency anemia, unspecified: Secondary | ICD-10-CM | POA: Insufficient documentation

## 2016-08-10 DIAGNOSIS — E559 Vitamin D deficiency, unspecified: Secondary | ICD-10-CM | POA: Insufficient documentation

## 2016-08-10 DIAGNOSIS — N926 Irregular menstruation, unspecified: Secondary | ICD-10-CM | POA: Insufficient documentation

## 2016-08-10 DIAGNOSIS — Z0000 Encounter for general adult medical examination without abnormal findings: Secondary | ICD-10-CM

## 2016-08-10 DIAGNOSIS — N644 Mastodynia: Secondary | ICD-10-CM

## 2016-08-10 DIAGNOSIS — E7800 Pure hypercholesterolemia, unspecified: Secondary | ICD-10-CM | POA: Insufficient documentation

## 2016-08-10 DIAGNOSIS — G8929 Other chronic pain: Secondary | ICD-10-CM | POA: Insufficient documentation

## 2016-08-10 LAB — POCT URINE PREGNANCY: PREG TEST UR: NEGATIVE

## 2016-08-10 LAB — LIPID PANEL
CHOLESTEROL: 181 mg/dL (ref 125–200)
HDL: 37 mg/dL — ABNORMAL LOW (ref >=46)
LDL Cholesterol: 96 mg/dL (ref <130)
Total CHOL/HDL Ratio: 4.9 Ratio (ref <=5.0)
Triglycerides: 238 mg/dL — ABNORMAL HIGH (ref <150)
VLDL: 48 mg/dL — ABNORMAL HIGH (ref <30)

## 2016-08-10 LAB — CBC
HCT: 31.8 % — ABNORMAL LOW (ref 35.0–45.0)
Hemoglobin: 10.3 g/dL — ABNORMAL LOW (ref 11.7–15.5)
MCH: 28.6 pg (ref 27.0–33.0)
MCHC: 32.4 g/dL (ref 32.0–36.0)
MCV: 88.3 fL (ref 80.0–100.0)
MPV: 10.3 fL (ref 7.5–12.5)
Platelets: 316 10*3/uL (ref 140–400)
RBC: 3.6 MIL/uL — AB (ref 3.80–5.10)
RDW: 14.4 % (ref 11.0–15.0)
WBC: 7 10*3/uL (ref 3.8–10.8)

## 2016-08-10 MED ORDER — FERROUS SULFATE 325 (65 FE) MG PO TABS: 325.0000 mg | ORAL_TABLET | Freq: Two times a day (BID) | ORAL | 3 refills | Status: DC

## 2016-08-10 MED ORDER — ONDANSETRON HCL 8 MG PO TABS: 8.0000 mg | ORAL_TABLET | Freq: Three times a day (TID) | ORAL | 0 refills | Status: DC | PRN

## 2016-08-10 MED ORDER — NORGESTIMATE-ETH ESTRADIOL 0.25-35 MG-MCG PO TABS: 1.0000 | ORAL_TABLET | Freq: Every day | ORAL | 3 refills | Status: DC

## 2016-08-10 MED FILL — ONDANSETRON HCL 8 MG TABLET: 8 | 6 days supply | Qty: 20 | Fill #0

## 2016-08-10 MED FILL — MONO-LINYAH 28 TABLET: 0.25-35 | 28 days supply | Qty: 28 | Fill #0

## 2016-08-10 MED FILL — FERROUS SULFATE 325 MG TAB: 325 (65 FE) | 30 days supply | Qty: 60 | Fill #0

## 2016-08-10 NOTE — Patient Instructions (Addendum)
Tynita was seen today for menstrual problem.  Diagnoses and all orders for this visit:  Irregular menses -     POCT urine pregnancy -     CBC -     US Pelvis Complete; Future -     US Transvaginal Non-OB; Future -     ferrous sulfate 325 (65 FE) MG tablet; Take 1 tablet (325 mg total) by mouth 2 (two) times daily with a meal. -     ondansetron (ZOFRAN) 8 MG tablet; Take 1 tablet (8 mg total) by mouth every 8 (eight) hours as needed for nausea or vomiting. -     norgestimate-ethinyl estradiol (SPRINTEC 28) 0.25-35 MG-MCG tablet; Take 1 tablet by mouth daily.  Iron deficiency anemia, unspecified iron deficiency anemia type -     ferrous sulfate 325 (65 FE) MG tablet; Take 1 tablet (325 mg total) by mouth 2 (two) times daily with a meal.  HYPERCHOLESTEROLEMIA -     Lipid Panel  Vitamin D deficiency -     Vitamin D, 25-hydroxy   Start daily birth control pills to regulate periods This is a better tolerated option than provera   Ultrasounds scheduled  You will be called with results of labs  F/u in 4 weeks for irregular menses  Dr. Adrian Blackwater

## 2016-08-10 NOTE — Progress Notes (Signed)
Pt is here for heavy bleeding, pt has had period since 09/21.   Pt states that she is having pain in here beast.  Pt declined flu shot.

## 2016-08-10 NOTE — Assessment & Plan Note (Signed)
Prolonged menstrual bleeding with concern for anemia  Plan: Combined OCPs to regulate period  Pelvic and TVUS Oral iron therapy

## 2016-08-10 NOTE — Progress Notes (Signed)
Subjective:  Patient ID: Katherine [name], female    DOB: 1981-06-09  Age: 35 y.o. MRN: [id number]  CC: Menstrual Problem   HPI Syri Zenger  Has HLD, hx of breast pain with normal breast ultrasound and mammogram she presents for   1. Irregular menses: patient reports menstrual bleeding since 07/09/2016. At times her bleeding is heavy and at times light. She is not taking birth control. She is sexually active with one partner. She had a normal pap smear that was HPV negative on 11/15/2014. She has history of prolonged periods up to 15 days but this is the longest. She reports hx of anemia with her last pregnancy. She is experiencing some fatigue. No syncope. She has hx of vit D deficiency.  2. Breast pains: b/l. Normal b/l breast ultrasound and mammogram in 2016. She still has breast pains. No lumps. She is overdue for her follow up screening mammogram.   Social History  Substance Use Topics  . Smoking status: Never Smoker  . Smokeless tobacco: Never Used  . Alcohol use No    Outpatient Medications Prior to Visit  Medication Sig Dispense Refill  . atorvastatin (LIPITOR) 10 MG tablet Take 1 tablet (10 mg total) by mouth daily. 30 tablet 3  . HYDROcodone-acetaminophen (NORCO/VICODIN) 5-325 MG per tablet 1 to 2 tabs every 4 to 6 hours as needed for pain. (Patient not taking: Reported on 11/15/2014) 20 tablet 0  . nystatin (MYCOSTATIN) cream Apply topically as needed.      . Vitamin D, Ergocalciferol, (DRISDOL) 50000 UNITS CAPS capsule Take 1 capsule (50,000 Units total) by mouth every 7 (seven) days. 12 capsule 0   No facility-administered medications prior to visit.     ROS Review of Systems  Constitutional: Positive for fatigue. Negative for chills and fever.  Eyes: Negative for visual disturbance.  Respiratory: Negative for shortness of breath.   Cardiovascular: Negative for chest pain.  Gastrointestinal: Positive for abdominal pain (RUQ pain x 3 years ) and nausea.  Negative for blood in stool.  Genitourinary: Positive for menstrual problem and pelvic pain. Negative for genital sores.  Musculoskeletal: Negative for arthralgias and back pain.  Skin: Negative for rash.  Allergic/Immunologic: Negative for immunocompromised state.  Hematological: Negative for adenopathy. Does not bruise/bleed easily.  Psychiatric/Behavioral: Negative for dysphoric mood and suicidal ideas.   Objective:  BP 134/84 (BP Location: Left Arm, Patient Position: Sitting, Cuff Size: Small)   Pulse 71   Temp 98.8 F (37.1 C) (Oral)   Ht 5\' 2"  (1.575 m)   Wt 177 lb 9.6 oz (80.6 kg)   LMP 07/09/2016   SpO2 100%   BMI 32.48 kg/m   BP/Weight 08/10/2016 12/13/2014 [id number]  Systolic BP [id number]  Diastolic BP 84 68 83  Wt. (Lbs) 177.6 182 179  BMI 32.48 33.28 32.73     Physical Exam  Constitutional: She is oriented to person, place, and time. She appears well-developed and well-nourished. No distress.  HENT:  Head: Normocephalic and atraumatic.  Eyes:    Cardiovascular: Normal rate, regular rhythm, normal heart sounds and intact distal pulses.   Pulmonary/Chest: Effort normal and breath sounds normal.  Abdominal: Soft. Bowel sounds are normal. She exhibits no distension and no mass. There is tenderness in the right upper quadrant, right lower quadrant and left lower quadrant. There is no rebound and no guarding.  Genitourinary: Vagina normal and uterus normal. Pelvic exam was performed with patient prone. There is no rash, tenderness or lesion on the right  labia. There is no rash, tenderness or lesion on the left labia. Cervix exhibits discharge. Cervix exhibits no motion tenderness and no friability.    Musculoskeletal: She exhibits no edema.  Lymphadenopathy:       Right: No inguinal adenopathy present.       Left: No inguinal adenopathy present.  Neurological: She is alert and oriented to person, place, and time.  Skin: Skin is warm and dry. No rash noted.    Psychiatric: She has a normal mood and affect.   U preg: negative   Lab Results  Component Value Date   WBC 7.7 11/15/2014   HGB 13.2 11/15/2014   HCT 38.4 11/15/2014   MCV 89.1 11/15/2014   PLT 298 11/15/2014    Assessment & Plan:  Katherine [name] was seen today for menstrual problem.  Diagnoses and all orders for this visit:  Irregular menses -     POCT urine pregnancy -     CBC -     US Pelvis Complete; Future -     US Transvaginal Non-OB; Future -     ferrous sulfate 325 (65 FE) MG tablet; Take 1 tablet (325 mg total) by mouth 2 (two) times daily with a meal. -     ondansetron (ZOFRAN) 8 MG tablet; Take 1 tablet (8 mg total) by mouth every 8 (eight) hours as needed for nausea or vomiting. -     norgestimate-ethinyl estradiol (SPRINTEC 28) 0.25-35 MG-MCG tablet; Take 1 tablet by mouth daily.  Iron deficiency anemia, unspecified iron deficiency anemia type -     ferrous sulfate 325 (65 FE) MG tablet; Take 1 tablet (325 mg total) by mouth 2 (two) times daily with a meal.  HYPERCHOLESTEROLEMIA -     Lipid Panel  Vitamin D deficiency -     Vitamin D, 25-hydroxy  Healthcare maintenance -     MM DIGITAL SCREENING BILATERAL; Future   There are no diagnoses linked to this encounter.  No orders of the defined types were placed in this encounter.   Follow-up: No Follow-up on file.   Boykin Nearing MD

## 2016-08-11 LAB — VITAMIN D 25 HYDROXY (VIT D DEFICIENCY, FRACTURES): VIT D 25 HYDROXY: 14 ng/mL — AB (ref 30–100)

## 2016-08-11 MED ORDER — VITAMIN D (ERGOCALCIFEROL) 1.25 MG (50000 UNIT) PO CAPS: 50000.0000 [IU] | ORAL_CAPSULE | ORAL | 0 refills | Status: DC

## 2016-08-11 MED ORDER — ATORVASTATIN CALCIUM 10 MG PO TABS: 10.0000 mg | ORAL_TABLET | Freq: Every day | ORAL | 11 refills | Status: DC

## 2016-08-11 MED FILL — VIT D2 1.25 MG (50,000 UNIT: 1.25 MG | 56 days supply | Qty: 8 | Fill #0

## 2016-08-11 MED FILL — ATORVASTATIN 10 MG TABLET: 10 | 30 days supply | Qty: 30 | Fill #0

## 2016-08-11 NOTE — Addendum Note (Signed)
Addended by: Boykin Nearing on: 08/11/2016 09:02 AM   Modules accepted: Orders

## 2016-08-13 ENCOUNTER — Telehealth: Payer: Self-pay

## 2016-08-14 ENCOUNTER — Telehealth: Payer: Self-pay

## 2016-08-14 NOTE — Telephone Encounter (Signed)
Pt was called on 10/27 and informed of lab results and medication changes.

## 2016-08-19 ENCOUNTER — Encounter (HOSPITAL_COMMUNITY): Payer: Self-pay

## 2016-08-19 ENCOUNTER — Inpatient Hospital Stay (HOSPITAL_COMMUNITY)
Admission: AD | Admit: 2016-08-19 | Discharge: 2016-08-19 | Disposition: A | Discharge status: Home / Self Care | Payer: Self-pay | Source: Ambulatory Visit | Attending: Obstetrics & Gynecology | Admitting: Obstetrics & Gynecology

## 2016-08-19 DIAGNOSIS — K644 Residual hemorrhoidal skin tags: Secondary | ICD-10-CM | POA: Insufficient documentation

## 2016-08-19 DIAGNOSIS — Z3202 Encounter for pregnancy test, result negative: Secondary | ICD-10-CM | POA: Insufficient documentation

## 2016-08-19 LAB — URINALYSIS, ROUTINE W REFLEX MICROSCOPIC
BILIRUBIN URINE: NEGATIVE
Glucose, UA: NEGATIVE mg/dL
HGB URINE DIPSTICK: NEGATIVE
Ketones, ur: NEGATIVE mg/dL
Leukocytes, UA: NEGATIVE
NITRITE: NEGATIVE
PROTEIN: NEGATIVE mg/dL
SPECIFIC GRAVITY, URINE: 1.025 (ref 1.005–1.030)
pH: 6 (ref 5.0–8.0)

## 2016-08-19 LAB — POCT PREGNANCY, URINE: PREG TEST UR: NEGATIVE

## 2016-08-19 MED ORDER — HYDROCORTISONE ACE-PRAMOXINE 1-1 % RE FOAM: 1.0000 | Freq: Three times a day (TID) | RECTAL | 1 refills | Status: DC

## 2016-08-19 MED ORDER — IBUPROFEN 800 MG PO TABS: 800.0000 mg | ORAL_TABLET | Freq: Three times a day (TID) | ORAL | 0 refills | Status: DC | PRN

## 2016-08-19 MED ORDER — LIDOCAINE HCL 2 % EX GEL
1.0000 "application " | Freq: Once | CUTANEOUS | Status: AC
  Administered 2016-08-19: 1 via TOPICAL
  Filled 2016-08-19: qty 5

## 2016-08-19 MED ORDER — KETOROLAC TROMETHAMINE 60 MG/2ML IM SOLN
60.0000 mg | Freq: Once | INTRAMUSCULAR | Status: AC
  Administered 2016-08-19: 60 mg via INTRAMUSCULAR
  Filled 2016-08-19: qty 2

## 2016-08-19 NOTE — MAU Provider Note (Signed)
History     CSN: [id number]  Arrival date and time: 08/19/16 [id number]   First Provider Initiated Contact with Patient 08/19/16 2139      Chief Complaint  Patient presents with  . Rectal Pain   [name] is a 35 y.o. [id number] who presents today with 10/10 rectal pain. She states that she has had the pain for about three days, and it has been getting worse. She has a hx of hemorrhoids. She has not taken anything for the pain. Sitting makes it worse, and laying on her side helps.    Past Medical History:  Diagnosis Date  . Hemorrhoids   . Hyperlipidemia     Past Surgical History:  Procedure Laterality Date  . HEMORRHOID SURGERY  12/12/2010   Dr Greer Pickerel; excision of rt anterior mixed column hemorrhoid, rubber band ligation of Rt lateral int hemorrhoid    Family History  Problem Relation Age of Onset  . Diabetes Mother   . Hypertension Mother   . Diabetes Father   . Hypertension Father     Social History  Substance Use Topics  . Smoking status: Never Smoker  . Smokeless tobacco: Never Used  . Alcohol use No    Allergies: No Known Allergies  Prescriptions Prior to Admission  Medication Sig Dispense Refill Last Dose  . acetaminophen (TYLENOL) 500 MG tablet Take 500 mg by mouth every 6 (six) hours as needed.   08/19/2016 at Unknown time  . atorvastatin (LIPITOR) 10 MG tablet Take 1 tablet (10 mg total) by mouth daily. 30 tablet 11 08/19/2016 at Unknown time  . ferrous sulfate 325 (65 FE) MG tablet Take 1 tablet (325 mg total) by mouth 2 (two) times daily with a meal. 60 tablet 3 08/19/2016 at Unknown time  . norgestimate-ethinyl estradiol (SPRINTEC 28) 0.25-35 MG-MCG tablet Take 1 tablet by mouth daily. 1 Package 3 08/19/2016 at Unknown time  . ondansetron (ZOFRAN) 8 MG tablet Take 1 tablet (8 mg total) by mouth every 8 (eight) hours as needed for nausea or vomiting. 20 tablet 0 08/19/2016 at Unknown time  . Vitamin D, Ergocalciferol, (DRISDOL) 50000 units CAPS  capsule Take 1 capsule (50,000 Units total) by mouth every 7 (seven) days. 8 capsule 0 08/19/2016 at Unknown time  . nystatin (MYCOSTATIN) cream Apply topically as needed.     Unknown at Unknown time    Review of Systems  Constitutional: Negative for chills.  Gastrointestinal: Negative for constipation.  Genitourinary: Negative for dysuria, frequency and urgency.   Physical Exam   Blood pressure 123/67, pulse 97, temperature 98.5 F (36.9 C), temperature source Oral, resp. rate 18, height 5\' 2"  (1.575 m), weight 177 lb (80.3 kg), last menstrual period 07/09/2016, SpO2 99 %.  Physical Exam  Nursing note and vitals reviewed. Constitutional: She is oriented to person, place, and time. She appears well-developed and well-nourished. No distress.  HENT:  Head: Normocephalic.  Cardiovascular: Normal rate.   Respiratory: Effort normal.  GI: Soft. There is no tenderness. There is no rebound.  Genitourinary:  Genitourinary Comments: Small externalized hemorrhoid. Replaced back into the rectum.   Neurological: She is alert and oriented to person, place, and time.  Skin: Skin is warm and dry.  Psychiatric: She has a normal mood and affect.   Results for orders placed or performed during the hospital encounter of 08/19/16 (from the past 24 hour(s))  Urinalysis, Routine w reflex microscopic (not at [facility])     Status: None   Collection Time: 08/19/16  9:43 PM  Result Value Ref Range   Color, Urine YELLOW YELLOW   APPearance CLEAR CLEAR   Specific Gravity, Urine 1.025 1.005 - 1.030   pH 6.0 5.0 - 8.0   Glucose, UA NEGATIVE NEGATIVE mg/dL   Hgb urine dipstick NEGATIVE NEGATIVE   Bilirubin Urine NEGATIVE NEGATIVE   Ketones, ur NEGATIVE NEGATIVE mg/dL   Protein, ur NEGATIVE NEGATIVE mg/dL   Nitrite NEGATIVE NEGATIVE   Leukocytes, UA NEGATIVE NEGATIVE  Pregnancy, urine POC     Status: None   Collection Time: 08/19/16 10:17 PM  Result Value Ref Range   Preg Test, Ur NEGATIVE NEGATIVE    MAU  Course  Procedures  MDM Patient has had toradol and lidocaine jelly applied to hemorrhoid. She reports that her pain is now 0/10   Assessment and Plan   1. External hemorrhoid    DC home Comfort measures reviewed  RX: procotfoam as directed, ibuprofen 800mg  PRN #[id number]  Return to MAU as needed   Follow-up [facility] .   Contact information: [place] [address] [phone]            [name] 08/19/2016, 9:42 PM

## 2016-08-19 NOTE — Discharge Instructions (Signed)
Hemorroides  (Hemorrhoids) Las hemorroides son venas inflamadas alrededor del recto o del ano. Hay dos tipos de hemorroides:   Hemorroides internas. Se forman en las venas del interior del recto. Pueden abultarse hacia el exterior e irritarse y Secretary/administrator.  Hemorroides externas. Se producen en las venas externas al ano y pueden sentirse como un bulto o zona hinchada dura y dolorosa cerca del ano. CAUSAS   Embarazo.   Obesidad.   Constipacin o diarrea.   Dificultad para mover el intestino.   Permanecer sentado durante largos perodos en el inodoro.  Levantar objetos pesados u otras actividades que impliquen esfuerzo.  Sexo anal. SNTOMAS   Dolor.   Picazn o irritacin anal.   Sangrado rectal.   Prdida fecal.   Inflamacin anal.   Uno o ms bultos en la zona anal.  DIAGNSTICO  El mdico puede diagnosticar las hemorroides mediante un examen visual. Otros estudios o anlisis que se pueden realizar son:   Examen de la zona rectal con Ardelia Mems mano enguantada (examen digital rectal).   Examen del canal anal utilizando un pequeo tubo (endoscopio).   Anlisis de sangre si ha perdido [place] cantidad significativa de [place].  Un estudio para observar el interior del colon (sigmoideoscopa o colonoscopa). TRATAMIENTO  La mayora de las hemorroides pueden tratarse en casa. Sin embargo, si los sntomas no mejoran o tiene Nurse, children's, el mdico puede Optometrist un procedimiento para disminuir las hemorroides o extirparlas completamente. Los tratamientos posibles son:   Colocacin de una banda de goma en la base de la hemorroide para cortar la circulacin (ligadura con Forensic psychologist).   Inyeccin de una sustancia qumica para Neurosurgeon las hemorroides (escleroterapia).   Utilizacin de un instrumento para quemar las hemorroides (terapia con luz infrarroja).   Extirpacin quirrgica de la hemorroides (hemorroidectoma).   Colocacin de grapas en la hemorroides para  bloquear el flujo de sangre a los tejidos (engrapado de hemorroides).  INSTRUCCIONES PARA EL CUIDADO EN EL HOGAR   Consuma alimentos con fibra, como cereales integrales, legumbres, frutos secos, frutas y verduras. Pregntele a su mdico acerca de tomar productos con fibra aadida en ellos (suplementos defibra).  Aumente la ingestin de lquidos. Beba gran cantidad de lquido para mantener la orina de tono claro o color amarillo plido.   Haga ejercicios regularmente.   Vaya al bao cuando sienta la necesidad de mover el intestino. No espere.   Evite hacer fuerza al mover el intestino.   Mantenga la zona anal limpia y seca. Use papel higinico hmedo o toallitas humedecidas despus de mover el intestino.   Puede usar o Midwife segn las indicaciones algunas cremas especiales y supositorios.   Tome slo medicamentos de venta libre o recetados, segn las indicaciones del mdico.   Tome baos de asiento tibios durante 15-20 minutos, 3-4 veces por da para Glass blower/designer y las [place].   Coloque una bolsa de hielo sobre las hemorroides si le duelen o se hinchan. Usar compresas de [employer] baos de asiento puede ser [place].   Ponga el hielo en una bolsa plstica.   Colquese una toalla entre la piel y la bolsa de hielo.   Deje el hielo durante 15 - 20 minutos y aplquelo 3 - 4 veces por Training and development officer.   No utilice una almohada en forma de aro ni se siente en el inodoro durante perodos prolongados. Esto aumenta la afluencia de sangre y Conservation officer, historic buildings.  SOLICITE ATENCIN MDICA SI:   Aumenta el dolor y la hinchazn y no  puede controlarlo con la medicacin o con un tratamiento.  Tiene un sangrado que no IT consultant.  No puede mover el intestino.  Siente dolor o tiene inflamacin fuera de la zona de las hemorroides. ASEGRESE DE QUE:   Comprende estas instrucciones.  Controlar su enfermedad.  Recibir ayuda de inmediato si no mejora o si empeora.   Esta  informacin no tiene Marine scientist el consejo del mdico. Asegrese de hacerle al mdico cualquier pregunta que tenga.   Document Released: 10/05/2005 Document Revised: 06/07/2013 Elsevier Interactive Patient Education [employer].

## 2016-08-19 NOTE — MAU Note (Signed)
Pt c/o hemorrhoid and rectal pain that started yesterday. States that she has not had any constipation-stools have been soft. Last BM was today. Denies rectal bleeding.

## 2016-08-20 MED FILL — IBUPROFEN 800 MG TABLET: 800 | 10 days supply | Qty: 30 | Fill #0

## 2016-08-20 NOTE — Telephone Encounter (Signed)
Pt was called.

## 2016-08-31 ENCOUNTER — Ambulatory Visit (HOSPITAL_COMMUNITY)
Admission: RE | Admit: 2016-08-31 | Discharge: 2016-08-31 | Disposition: A | Discharge status: Home / Self Care | Payer: Self-pay | Source: Ambulatory Visit | Attending: Family Medicine | Admitting: Family Medicine

## 2016-08-31 DIAGNOSIS — R938 Abnormal findings on diagnostic imaging of other specified body structures: Secondary | ICD-10-CM | POA: Insufficient documentation

## 2016-08-31 DIAGNOSIS — N939 Abnormal uterine and vaginal bleeding, unspecified: Secondary | ICD-10-CM | POA: Insufficient documentation

## 2016-08-31 DIAGNOSIS — R109 Unspecified abdominal pain: Secondary | ICD-10-CM | POA: Insufficient documentation

## 2016-08-31 DIAGNOSIS — N926 Irregular menstruation, unspecified: Secondary | ICD-10-CM | POA: Insufficient documentation

## 2016-08-31 MED FILL — MONO-LINYAH 28 TABLET: 0.25-35 | 28 days supply | Qty: 28 | Fill #1

## 2016-09-01 ENCOUNTER — Other Ambulatory Visit (HOSPITAL_COMMUNITY): Payer: Self-pay | Admitting: *Deleted

## 2016-09-01 DIAGNOSIS — N644 Mastodynia: Secondary | ICD-10-CM

## 2016-09-02 ENCOUNTER — Telehealth: Payer: Self-pay

## 2016-09-02 NOTE — Telephone Encounter (Signed)
Katherine [name] [id number] called and a VM was left informing pt of lab results.

## 2016-09-09 MED FILL — FERROUS SULFATE 325 MG TAB: 325 (65 FE) | 30 days supply | Qty: 60 | Fill #1

## 2016-09-09 MED FILL — ATORVASTATIN 10 MG TABLET: 10 | 30 days supply | Qty: 30 | Fill #1

## 2016-09-17 ENCOUNTER — Ambulatory Visit: Payer: Self-pay | Attending: Family Medicine | Admitting: Family Medicine

## 2016-09-17 ENCOUNTER — Encounter: Payer: Self-pay | Admitting: Family Medicine

## 2016-09-17 VITALS — BP 119/79 | HR 78 | Temp 98.3°F | Ht 62.0 in | Wt 181.2 lb

## 2016-09-17 DIAGNOSIS — R9389 Abnormal findings on diagnostic imaging of other specified body structures: Secondary | ICD-10-CM | POA: Insufficient documentation

## 2016-09-17 DIAGNOSIS — N926 Irregular menstruation, unspecified: Secondary | ICD-10-CM | POA: Insufficient documentation

## 2016-09-17 DIAGNOSIS — R938 Abnormal findings on diagnostic imaging of other specified body structures: Secondary | ICD-10-CM | POA: Insufficient documentation

## 2016-09-17 MED ORDER — IBUPROFEN 800 MG PO TABS: 800.0000 mg | ORAL_TABLET | Freq: Three times a day (TID) | ORAL | 2 refills | Status: DC | PRN

## 2016-09-17 NOTE — Progress Notes (Signed)
Pt is here to follow up on irregular menses.

## 2016-09-17 NOTE — Progress Notes (Signed)
Subjective:  Patient ID: Katherine [name], female    DOB: [birth year]  Age: 35 y.o. MRN: [id number]  CC: Menstrual Problem   HPI Katherine [name] presents for   1. Menstrual problems:  Still with prolonged menstrual periods with heavy bleeding and cramping. Her heavy bleeding and severe stomach pains last for first 3-5 days. She is currently bleeding very little.  She reports that taking birth control pills and iron. She feels less fatigue but overall feels the same.   LMP 08/28/2016-09/11/2016.  Pap 11/15/2014. Negative cytology. HPV negative. Pelvic and TVUS 08/31/16: markedly thickened and heterogenous endometrium.   Social History  Substance Use Topics  . Smoking status: Never Smoker  . Smokeless tobacco: Never Used  . Alcohol use No   Outpatient Medications Prior to Visit  Medication Sig Dispense Refill  . atorvastatin (LIPITOR) 10 MG tablet Take 1 tablet (10 mg total) by mouth daily. 30 tablet 11  . ferrous sulfate 325 (65 FE) MG tablet Take 1 tablet (325 mg total) by mouth 2 (two) times daily with a meal. 60 tablet 3  . hydrocortisone-pramoxine (PROCTOFOAM HC) rectal foam Place 1 applicator rectally 3 (three) times daily. 10 g 1  . norgestimate-ethinyl estradiol (SPRINTEC 28) 0.25-35 MG-MCG tablet Take 1 tablet by mouth daily. 1 Package 3  . ondansetron (ZOFRAN) 8 MG tablet Take 1 tablet (8 mg total) by mouth every 8 (eight) hours as needed for nausea or vomiting. 20 tablet 0  . Vitamin D, Ergocalciferol, (DRISDOL) 50000 units CAPS capsule Take 50,000 Units by mouth every Friday.    Marland Kitchen acetaminophen (TYLENOL) 500 MG tablet Take 500 mg by mouth every 6 (six) hours as needed for mild pain, moderate pain or headache.     . ibuprofen (ADVIL,MOTRIN) 800 MG tablet Take 1 tablet (800 mg total) by mouth every 8 (eight) hours as needed. (Patient not taking: Reported on 09/17/2016) 30 tablet 0   No facility-administered medications prior to visit.     ROS Review of  Systems  Constitutional: Positive for fatigue. Negative for chills and fever.  Eyes: Negative for visual disturbance.  Respiratory: Negative for shortness of breath.   Cardiovascular: Negative for chest pain.  Gastrointestinal: Positive for abdominal pain (RUQ pain x 3 years ) and nausea. Negative for blood in stool.  Genitourinary: Positive for menstrual problem and pelvic pain. Negative for genital sores.  Musculoskeletal: Negative for arthralgias and back pain.  Skin: Negative for rash.  Allergic/Immunologic: Negative for immunocompromised state.  Hematological: Negative for adenopathy. Does not bruise/bleed easily.  Psychiatric/Behavioral: Negative for dysphoric mood and suicidal ideas.    Objective:  BP 119/79 (BP Location: Right Arm, Patient Position: Sitting, Cuff Size: Small)   Pulse 78   Temp 98.3 F (36.8 C) (Oral)   Ht 5\' 2"  (1.575 m)   Wt 181 lb 3.2 oz (82.2 kg)   LMP 09/11/2016   SpO2 100%   BMI 33.14 kg/m   BP/Weight 09/17/2016 08/19/2016 0000000  Systolic BP [id number]  Diastolic BP 79 67 84  Wt. (Lbs) 181.2 177 177.6  BMI 33.14 32.37 32.48   Physical Exam  Constitutional: She is oriented to person, place, and time. She appears well-developed and well-nourished. No distress.  HENT:  Head: Normocephalic and atraumatic.  Cardiovascular: Normal rate, regular rhythm, normal heart sounds and intact distal pulses.   Pulmonary/Chest: Effort normal and breath sounds normal.  Musculoskeletal: She exhibits no edema.  Neurological: She is alert and oriented to person, place, and time.  Skin:  Skin is warm and dry. No rash noted.  Psychiatric: She has a normal mood and affect.     Assessment & Plan:  Katherine [name] was seen today for menstrual problem.  Diagnoses and all orders for this visit:  Irregular menses -     Ambulatory referral to Gynecology -     ibuprofen (ADVIL,MOTRIN) 800 MG tablet; Take 1 tablet (800 mg total) by mouth every 8 (eight) hours as needed  for cramping.  Endometrial thickening on ultra sound -     Ambulatory referral to Gynecology   There are no diagnoses linked to this encounter.  No orders of the defined types were placed in this encounter.   Follow-up: Return in about 2 months (around 11/17/2016) for menstral pain .   Boykin Nearing MD

## 2016-09-17 NOTE — Patient Instructions (Addendum)
Katherine [name] was seen today for menstrual problem.  Diagnoses and all orders for this visit:  Irregular menses -     Ambulatory referral to Gynecology -     ibuprofen (ADVIL,MOTRIN) 800 MG tablet; Take 1 tablet (800 mg total) by mouth every 8 (eight) hours as needed for cramping.  Endometrial thickening on ultra sound -     Ambulatory referral to Gynecology   Take ibuprofen or aleve on the day before and for the first 5 days of your periods to help with bleeding and cramping  You will be contacted with gynecology appointment  F/u in 2 months for menstrual pains   Dr. Adrian Blackwater

## 2016-09-21 NOTE — Assessment & Plan Note (Signed)
Irregular menses with cramping Little improvement with OCP Thickened endometrium on pelvic and transvaginal ultrasound  Plan: Gyn referral NSAID

## 2016-10-01 ENCOUNTER — Ambulatory Visit
Admission: RE | Admit: 2016-10-01 | Discharge: 2016-10-01 | Disposition: A | Discharge status: Home / Self Care | Payer: No Typology Code available for payment source | Source: Ambulatory Visit | Attending: Obstetrics and Gynecology | Admitting: Obstetrics and Gynecology

## 2016-10-01 ENCOUNTER — Ambulatory Visit (HOSPITAL_COMMUNITY)
Admission: RE | Admit: 2016-10-01 | Discharge: 2016-10-01 | Disposition: A | Discharge status: Home / Self Care | Payer: Self-pay | Source: Ambulatory Visit | Attending: Obstetrics and Gynecology | Admitting: Obstetrics and Gynecology

## 2016-10-01 ENCOUNTER — Encounter (HOSPITAL_COMMUNITY): Payer: Self-pay

## 2016-10-01 VITALS — BP 118/76 | Temp 98.3°F | Ht 62.0 in | Wt 176.6 lb

## 2016-10-01 DIAGNOSIS — Z1239 Encounter for other screening for malignant neoplasm of breast: Secondary | ICD-10-CM

## 2016-10-01 DIAGNOSIS — N644 Mastodynia: Secondary | ICD-10-CM

## 2016-10-01 HISTORY — DX: Anxiety disorder, unspecified: F41.9

## 2016-10-01 HISTORY — DX: Abnormal uterine and vaginal bleeding, unspecified: N93.9

## 2016-10-01 MED FILL — MONO-LINYAH 28 TABLET: 0.25-35 | 28 days supply | Qty: 28 | Fill #2

## 2016-10-01 NOTE — Progress Notes (Signed)
Complaints of bilateral breast pain and swelling x 6 months that is greater within the left breast. Patient rates the pain at a 5 out of 10.  Pap Smear: Pap smear not completed today. Last Pap smear was 11/15/2014 at [facility] and normal with negative HPV. Per patient has no history of an abnormal Pap smear. Last two Pap smear results are in EPIC.  Physical exam: Breasts Breasts symmetrical. No skin abnormalities bilateral breasts. No nipple retraction bilateral breasts. No nipple discharge bilateral breasts. No lymphadenopathy. No lumps palpated bilateral breasts. Complaints of left outer and center pain on exam. Complaints of right pain at the center of the breast on exam. Referred patient to the [facility] for diagnostic mammogram. Appointment scheduled for Thursday, October 01, 2016 at 1340.        Pelvic/Bimanual No Pap smear completed today since last Pap smear and HPV typing was 11/15/2014. Pap smear not indicated per BCCCP guidelines.   Smoking History: Patient has never smoked.  Patient Navigation: Patient education provided. Access to services provided for patient through [facility] program. Spanish interpreter provided.  Used Spanish interpreter [employer] from [place].

## 2016-10-01 NOTE — Patient Instructions (Signed)
Explained breast self awareness to [facility]. Patient did not need a Pap smear today due to last Pap smear and HPV typing was 11/15/2014. Let her know BCCCP will cover Pap smears and HPV typing every 5 years unless has a history of abnormal Pap smears. Referred patient to the [facility] for diagnostic mammogram. Appointment scheduled for Thursday, October 01, 2016 at 1340. Katherine [name] verbalized understanding.  [name], [name], RN 12:49 PM

## 2016-10-02 ENCOUNTER — Encounter (HOSPITAL_COMMUNITY): Payer: Self-pay | Admitting: *Deleted

## 2016-10-26 MED FILL — ?ATORVASTATIN 10 MG TABLET: 10 | 30 days supply | Qty: 30 | Fill #2

## 2016-10-26 MED FILL — MONO-LINYAH 28 TABLET: 0.25-35 | 28 days supply | Qty: 28 | Fill #3

## 2016-11-18 ENCOUNTER — Encounter: Payer: Self-pay | Admitting: Obstetrics & Gynecology

## 2016-11-18 ENCOUNTER — Other Ambulatory Visit (HOSPITAL_COMMUNITY)
Admission: RE | Admit: 2016-11-18 | Discharge: 2016-11-18 | Disposition: A | Discharge status: Home / Self Care | Payer: Self-pay | Source: Ambulatory Visit | Attending: Obstetrics & Gynecology | Admitting: Obstetrics & Gynecology

## 2016-11-18 ENCOUNTER — Ambulatory Visit (INDEPENDENT_AMBULATORY_CARE_PROVIDER_SITE_OTHER): Payer: Self-pay | Admitting: Obstetrics & Gynecology

## 2016-11-18 VITALS — BP 128/85 | HR 80 | Ht 62.0 in | Wt 180.5 lb

## 2016-11-18 DIAGNOSIS — N939 Abnormal uterine and vaginal bleeding, unspecified: Secondary | ICD-10-CM | POA: Insufficient documentation

## 2016-11-18 DIAGNOSIS — R9389 Abnormal findings on diagnostic imaging of other specified body structures: Secondary | ICD-10-CM

## 2016-11-18 DIAGNOSIS — N926 Irregular menstruation, unspecified: Secondary | ICD-10-CM

## 2016-11-18 DIAGNOSIS — R938 Abnormal findings on diagnostic imaging of other specified body structures: Secondary | ICD-10-CM

## 2016-11-18 DIAGNOSIS — Z3202 Encounter for pregnancy test, result negative: Secondary | ICD-10-CM

## 2016-11-18 LAB — POCT PREGNANCY, URINE: Preg Test, Ur: NEGATIVE

## 2016-11-18 MED ORDER — MEGESTROL ACETATE 40 MG PO TABS: 40.0000 mg | ORAL_TABLET | Freq: Two times a day (BID) | ORAL | 5 refills | Status: DC

## 2016-11-18 MED FILL — MEGESTROL 40 MG TABLET: 40 | 15 days supply | Qty: 60 | Fill #0

## 2016-11-18 NOTE — Progress Notes (Signed)
Used Interpreter [employer].Here for irregular periods.

## 2016-11-18 NOTE — Progress Notes (Signed)
GYNECOLOGY OFFICE VISIT NOTE  History:  36 y.o. [id number] here today for AUB management.  Patient is Spanish-speaking only, Spanish interpreter present for this encounter.  Has been evaluated and treated by her PCP, currently on OCPs. She feels this does not help her bleeding.  Still has daily spotting-moderate bleeding, no associated pain.   She denies any abnormal vaginal discharge, pelvic pain or other concerns. Of note, ultrasound in 08/2016 showed 4 cm heterogenous endometrium.  Past Medical History:  Diagnosis Date  . Abnormal uterine bleeding   . Anxiety   . Hemorrhoids   . Hyperlipidemia     Past Surgical History:  Procedure Laterality Date  . HEMORRHOID SURGERY  12/12/2010   Dr Greer Pickerel; excision of rt anterior mixed column hemorrhoid, rubber band ligation of Rt lateral int hemorrhoid    The following portions of the patient's history were reviewed and updated as appropriate: allergies, current medications, past family history, past medical history, past social history, past surgical history and problem list.   Health Maintenance:  Normal pap and negative HRHPV in 2016.    Review of Systems:  Pertinent items noted in HPI and remainder of comprehensive ROS otherwise negative.   Objective:  Physical Exam BP 128/85   Pulse 80   Ht 5\' 2"  (1.575 m)   Wt 180 lb 8 oz (81.9 kg)   LMP 09/27/2016   BMI 33.01 kg/m  CONSTITUTIONAL: Well-developed, well-nourished female in no acute distress.  HENT:  Normocephalic, atraumatic. External right and left ear normal. Oropharynx is clear and moist EYES: Conjunctivae and EOM are normal. Pupils are equal, round, and reactive to light. No scleral icterus.  NECK: Normal range of motion, supple, no masses SKIN: Skin is warm and dry. No rash noted. Not diaphoretic. No erythema. No pallor. NEUROLOGIC: Alert and oriented to person, place, and time. Normal reflexes, muscle tone coordination. No cranial nerve deficit noted. PSYCHIATRIC:  Normal mood and affect. Normal behavior. Normal judgment and thought content. CARDIOVASCULAR: Normal heart rate noted RESPIRATORY: Effort and breath sounds normal, no problems with respiration noted ABDOMEN: Soft, no distention noted.   PELVIC: Normal appearing external genitalia; normal appearing vaginal mucosa and cervix.  Small amount of blood noted in vagina, no active bleeding. Normal uterine size, no other palpable masses, no uterine or adnexal tenderness. MUSCULOSKELETAL: Normal range of motion. No edema noted.   ENDOMETRIAL BIOPSY     The indications for endometrial biopsy were reviewed.   Risks of the biopsy including cramping, bleeding, infection, uterine perforation, inadequate specimen and need for additional procedures  were discussed. The patient states she understands and agrees to undergo procedure today. Consent was signed. Time out was performed. Urine HCG was negative. During the pelvic exam, the cervix was prepped with Betadine. A single-toothed tenaculum was placed on the anterior lip of the cervix to stabilize it. The 3 mm pipelle was introduced into the endometrial cavity without difficulty to a depth of 10 cm, and a moderate amount of tissue was obtained and sent to pathology. The instruments were removed from the patient's vagina. Minimal bleeding from the cervix was noted. The patient tolerated the procedure well. Routine post-procedure instructions were given to the patient.     Labs and Imaging 08/31/2016 TRANSABDOMINAL AND TRANSVAGINAL ULTRASOUND OF PELVIS  CLINICAL DATA:  Irregular menses.  COMPARISON:  CT abdomen pelvis 07/12/2008.  FINDINGS: Uterus  Measurements: 11.4 x 6.5 x 6.1 cm. No fibroids or other mass visualized.  Endometrium  Thickness: 4.1 cm.  Appears  heterogeneous.  Right ovary  Measurements: 2.7 x 1.2 x 3.3 cm. Normal appearance/no adnexal mass.  Left ovary  Measurements: 2.4 x 2.3 x 2.5 cm. Normal appearance/no adnexal  mass.  Other findings  No abnormal free fluid.  IMPRESSION: Markedly thickened and heterogeneous endometrium.   Electronically Signed   By: Lorin Picket M.D.   On: 08/31/2016 13:12  Assessment & Plan:  1. Endometrial thickening on ultra sound 2. Irregular menses Endometrial biopsy done, will follow up results and manage accordingly.   Will give Megace for bleeding for now for management.  - megestrol (MEGACE) 40 MG tablet; Take 1 tablet (40 mg total) by mouth 2 (two) times daily. Can increase to 80 mg twice a day in the event of heavy bleeding  Dispense: 60 tablet; Refill: 5 Return in about 4 weeks (around 12/16/2016) for Followup for AUB management.    Verita Schneiders, MD, [place] Attending [place], [facility] for [employer], [place]

## 2016-11-18 NOTE — Patient Instructions (Signed)
Metrorragia funcional  (Dysfunctional Uterine Bleeding)  La metrorragia funcional es una hemorragia anormal proveniente del útero. La metrorragia funcional incluye estos síntomas:  · Menstruación que se adelanta o se atrasa.  · Menstruación menos o más abundante, o con coágulos sanguíneos.  · Hemorragias entre los períodos menstruales.  · Ausencia de una o más menstruaciones.  · Hemorragias luego de mantener relaciones sexuales.  · Sangrado luego de la menopausia.  INSTRUCCIONES PARA EL CUIDADO EN EL HOGAR  Esté atenta a cualquier cambio en los síntomas. Estas indicaciones pueden ayudarla con el trastorno:  Comidas  · Siga una dieta equilibrada. Incluya alimentos con alto contenido de hierro, como hígado, carne, mariscos, verduras de hoja verde y huevos.  · Si tiene estreñimiento:  ? Beba abundante agua.  ? Consuma frutas y verduras con alto contenido de agua y fibra, como espinaca, zanahorias, frambuesas, manzanas y mango.  Medicamentos  · Tome los medicamentos de venta libre y los recetados solamente como se lo haya indicado el médico.  · No haga cambios en los medicamentos sin hablar con el médico.  · La aspirina o los medicamentos que la contienen pueden aumentar la hemorragia. No tome esos medicamentos:  ? Durante la semana previa a la menstruación.  ? Durante la menstruación.  · Si le recetaron comprimidos de hierro, tómelos como se lo haya indicado el médico. Estos ayudan a reponer el hierro que el organismo pierde debido a este trastorno.  Actividad  · Si debe cambiarse el apósito o el tampón más de una vez cada 2 horas:  ? Acuéstese con los pies elevados.  ? Colóquese una compresa fría en la parte baja del abdomen.  ? Haga todo el reposo que pueda hasta que la hemorragia se detenga o disminuya.  · No trate de adelgazar hasta que la hemorragia se detenga y los niveles de hierro en la sangre se normalicen.  Otras indicaciones  · Durante dos meses, anote lo siguiente:  ? La fecha de comienzo de la  menstruación.  ? La fecha de su finalización.  ? Los momentos en los que tiene una hemorragia anormal.  ? Los problemas que advierte.  · Concurra a todas las visitas de control como se lo haya indicado el médico. Esto es importante.  SOLICITE ATENCIÓN MÉDICA SI:  · Se siente débil o que va a desvanecerse.  · Tiene náuseas y vómitos.  · No puede comer ni beber sin vomitar.  · Tiene mareos o diarrea mientras toma los medicamentos.  · Está tomando anticonceptivos u hormonas, y desea cambiar o suspender estos medicamentos.    SOLICITE ATENCIÓN MÉDICA DE INMEDIATO SI:  · Tiene escalofríos o fiebre.  · Debe cambiarse el apósito o el tampón más de una vez por hora.  · La hemorragia se vuelve más abundante o el flujo menstrual contiene coágulos con más frecuencia.  · Siente dolor en el abdomen.  · Pierde la conciencia.  · Le aparece una erupción cutánea.    Esta información no tiene como fin reemplazar el consejo del médico. Asegúrese de hacerle al médico cualquier pregunta que tenga.  Document Released: 07/15/2005 Document Revised: 06/26/2015 Document Reviewed: 12/31/2014  Elsevier Interactive Patient Education © 2017 [employer]

## 2016-11-23 ENCOUNTER — Telehealth: Payer: Self-pay

## 2016-11-23 NOTE — Telephone Encounter (Signed)
Patient has been informed of negative biopsy.

## 2016-11-23 NOTE — Telephone Encounter (Signed)
-----   Message from Osborne Oman, MD sent at 11/23/2016  9:11 AM EST ----- Endometrium, biopsy - INACTIVE ENDOMETRIUM. - NO EVIDENCE OF HYPERPLASIA OR MALIGNANCY. Please call to inform patient of benign results. Spanish speaking only.

## 2016-11-30 MED FILL — ?ATORVASTATIN 10 MG TABLET: 10 | 30 days supply | Qty: 30 | Fill #3

## 2016-12-17 ENCOUNTER — Encounter: Payer: Self-pay | Admitting: Obstetrics & Gynecology

## 2016-12-17 ENCOUNTER — Ambulatory Visit (INDEPENDENT_AMBULATORY_CARE_PROVIDER_SITE_OTHER): Payer: Self-pay | Admitting: Obstetrics & Gynecology

## 2016-12-17 DIAGNOSIS — R938 Abnormal findings on diagnostic imaging of other specified body structures: Secondary | ICD-10-CM

## 2016-12-17 DIAGNOSIS — N926 Irregular menstruation, unspecified: Secondary | ICD-10-CM

## 2016-12-17 DIAGNOSIS — R9389 Abnormal findings on diagnostic imaging of other specified body structures: Secondary | ICD-10-CM

## 2016-12-17 DIAGNOSIS — N939 Abnormal uterine and vaginal bleeding, unspecified: Secondary | ICD-10-CM | POA: Insufficient documentation

## 2016-12-17 MED ORDER — MEGESTROL ACETATE 40 MG PO TABS: 80.0000 mg | ORAL_TABLET | Freq: Two times a day (BID) | ORAL | 5 refills | Status: DC

## 2016-12-17 MED FILL — MEGESTROL 40 MG TABLET: 40 | 30 days supply | Qty: 120 | Fill #0

## 2016-12-17 NOTE — Patient Instructions (Signed)
Metrorragia funcional (Dysfunctional Uterine Bleeding) La metrorragia funcional es una hemorragia anormal proveniente del tero. La metrorragia funcional incluye estos sntomas:  Menstruacin que se adelanta o se atrasa.  Menstruacin menos o ms abundante, o con cogulos sanguneos.  Hemorragias entre los perodos [employer]ogg.  Ausencia de una o ms menstruaciones.  Hemorragias luego de Retail banker.  Sangrado luego de la menopausia. INSTRUCCIONES PARA EL CUIDADO EN EL HOGAR Est atenta a cualquier cambio en los sntomas. Estas indicaciones pueden ayudarla con el trastorno: Comidas  Siga una dieta equilibrada. Incluya alimentos con [employer]els de hierro, como hgado, carne, Occupational hygienist, verduras de hoja verde y [place].  Si tiene estreimiento:  Beba abundante agua.  Consuma frutas y verduras con alto contenido de agua y [place], [place] espinaca, zanahorias, frambuesas, manzanas y mango. Medicamentos  [employer] de venta libre y los recetados solamente como se lo haya indicado el mdico.  No haga cambios en los medicamentos sin hablar con el mdico.  La aspirina o los medicamentos que la contienen pueden aumentar la hemorragia. No tome esos medicamentos:  Durante la semana previa a Hydrographic surveyor.  Durante la [employer]ott.  Si le recetaron comprimidos de hierro, Social research officer, government se lo haya indicado el mdico. Estos ayudan a Camera operator hierro que el organismo pierde debido a este trastorno. Actividad  Si debe cambiarse el apsito o el tampn ms de una vez cada 2horas:  Acustese con los pies elevados.  Colquese una compresa fra en la parte baja del abdomen.  Haga todo el reposo que pueda hasta que la hemorragia se detenga o disminuya.  No trate de Psychologist, counselling que la hemorragia se detenga y los niveles de hierro en la sangre se normalicen. Otras indicaciones  [employer], anote lo siguiente:  La fecha de comienzo de Copy.  La fecha de su finalizacin.  Los [employer] que tiene una hemorragia anormal.  Los problemas que advierte.  Concurra a todas las visitas de control como se lo haya indicado el mdico. Esto es importante. SOLICITE ATENCIN MDICA SI:  Se siente dbil o que va a desvanecerse.  Tiene nuseas y vmitos.  No puede comer ni beber sin vomitar.  Tiene mareos o diarrea mientras toma los medicamentos.  Est tomando anticonceptivos u hormonas, y desea cambiar o suspender estos medicamentos. SOLICITE ATENCIN MDICA DE INMEDIATO SI:  Tiene escalofros o fiebre.  Debe cambiarse el apsito o el tampn ms de una vez por hora.  La hemorragia se vuelve ms abundante o el flujo menstrual contiene cogulos con ms frecuencia.  Siente dolor en el abdomen.  Pierde la conciencia.  Le aparece una erupcin cutnea. Esta informacin no tiene Marine scientist el consejo del mdico. Asegrese de hacerle al mdico cualquier pregunta que tenga. Document Released: 07/15/2005 Document Revised: 06/26/2015 Document Reviewed: 12/31/2014 Elsevier Interactive Patient Education  2017 [place] endometrio (Endometrial Ablation) En la ablacin del endometrio se extirpa el recubrimiento interno del tero (endometrio). Este es generalmente un tratamiento que se Scientist, water quality, en forma ambulatoria. La ablacin permite evitar una ciruga mayor, como la ciruga para extirpar el cuello del tero y Nurse, learning disability (histerectoma). Luego de la ablacin del endometrio, tendr poco o nada de flujo menstrual y no podr tener hijos. Sin embargo, si se encuentra en la etapa de la premenopausia, deber usar un mtodo confiable de control de la natalidad luego del procedimiento, ya que hay una pequea probabilidad de que ocurra un Media planner.  Hay diferentes razones para llevar a cabo este procedimiento, ellas son: Perodos abundantes. Sangrado que causa anemia. Sangrado  irregular. Fibromas que sangran en la superficie interior del tero, si no miden ms de 3 cm. Tal vez no sea posible realizarle este procedimiento si: Desea tener hijos en el futuro. Tiene clicos intensos durante el perodo menstrual. Tiene clulas precancerosas o cancerosas en el tero. Tuvo un embarazo reciente. Est cursando la menopausia. Se someti a [place] mayor de tero, la cual le produjo el afinamiento de la pared [place]. Las cirugas pueden incluir lo siguiente: La extirpacin de uno o ms fibromas uterinos (miomectoma). Ardelia Mems cesrea con una incisin clsica (vertical) en el tero. Pregntele al mdico qu tipo de Pensions consultant. En ocasiones, la Arts development officer piel es diferente de la que se forma en el tero. Aunque se haya sometido a una ciruga uterina, algunos tipos de ablacin an pueden ser seguros para su caso. Hable con el mdico. INFORME A SU MDICO: Cualquier alergia que tenga. Todos los [employer] Export, incluyendo vitaminas, hierbas, gotas oftlmicas, cremas y medicamentos de venta libre. Problemas previos que usted o los [employer] de su familia hayan tenido con el uso de anestsicos. Enfermedades de [employer]. Cirugas previas. Padecimientos mdicos. RIESGOS Y COMPLICACIONES Generalmente, este es un procedimiento seguro. Sin embargo, Games developer procedimiento, pueden surgir complicaciones. Las complicaciones posibles son: Perforacin del tero. Hemorragias. Infeccin en el tero, vejiga, o vagina. Lesin en los rganos circundantes. Ardelia Mems burbuja de aire en un pulmn (embolia de aire). Embarazo luego del procedimiento. Si el procedimiento no resuelve el problema, ser necesaria una histerectoma. Disminucin de la capacidad para Community education officer en el recubrimiento interno del tero. ANTES DEL PROCEDIMIENTO Deber controlarse la membrana que recubre el tero para asegurarse de que no hay clulas cancerosas o precancerosas. Le harn una  ecografa para observar el tamao del tero y verificar si hay anormalidades. Le darn medicamentos para Engineer, structural de la membrana que recubre el tero. PROCEDIMIENTO Durante el procedimiento, el medico usar un instrumento llamado resectoscopio para ver el interior del tero. Hay diferentes formas de extirpar la membrana que cubre el tero. Radiofrecuencia: en este mtodo se utiliza un aparato de radiofrecuencia, corriente elctrica alterna, para extirpar la membrana interna del tero. Crioterapia: en este mtodo se [place] fro extremo para congelar la membrana del tero. Lquido caliente: en este mtodo se utiliza solucin salina con el mismo objetivo. Microondas: Se utilizan microondas de alta energa para calentar la membrana y extirparla. Baln termal: consiste en insertar un catter con un baln en la punta dentro del tero. La punta del baln contiene un lquido caliente que elimina la membrana que tapiza el [place]. DESPUS DEL PROCEDIMIENTO Despus del procedimiento, no tenga relaciones sexuales ni inserte nada en la vagina hasta que su mdico la autorice. Despus del procedimiento podr experimentar: Clicos. Flujo vaginal. Ganas de orinar con frecuencia. Esta informacin no tiene Marine scientist el consejo del mdico. Asegrese de hacerle al mdico cualquier pregunta que tenga. Document Released: 06/07/2013 Document Revised: 06/26/2015 Document Reviewed: 03/08/2013 Elsevier Interactive Patient Education  2017 [place] (Hysterectomy Information) La histerectoma es una ciruga que se realiza para extirpar el tero. Esta ciruga se puede realizar para tratar varios problemas mdicos. Despus de la ciruga, no volver a Personal assistant. Adems, debido a esta ciruga no podr quedar embarazada (estril). Asimismo, durante esta ciruga se podrn extirpar las trompas de falopio y los ovarios (  salpingooforectoma  bilateral). RAZONES PARA REALIZAR UNA HISTERECTOMA  Sangrado persistente, anormal.  Dolor o infeccin persistente (crnico) en la pelvis.  El endometrio comienza a desarrollarse fuera del tero (endometriosis).  El endometrio comienza a desarrollarse en el msculo del tero (adenomiosis).  El tero desciende hacia la vagina (prolapso de los rganos de la pelvis).  Neoplasias benignas en el tero (fibromas uterinos) que provocan sntomas.  Clulas precancerosas.  Cncer cervical o cncer uterino. TIPOS DE HISTERECTOMA  Histerectoma supracervical: en este tipo de intervencin, se extirpa la parte superior del tero, pero no el cuello del tero.  Histerectoma total: se extirpan el tero y el cuello del tero.  Histerectoma radical: se extirpan el tero, el cuello del tero y los tejidos fibrosos que sostienen al tero en su lugar en la pelvis (parametrio). FORMAS EN QUE SE PUEDE REALIZAR UNA HISTERECTOMA  Histerectoma abdominal: se realiza un corte quirrgico grande (incisin) en el abdomen. Se extirpa el tero a travs de esta incisin.  Histerectoma vaginal: se realiza una incisin en la vagina. Se extirpa el tero a travs de esta incisin. No hay incisiones abdominales.  Histerectoma laparoscpica convencional: se realizan tres o cuatro incisiones pequeas en el abdomen. Se inserta un tubo delgado y luminoso con una cmara (laparoscopio) en una de las incisiones. A travs del resto de las incisiones se insertan otros instrumentos quirrgicos. El tero se corta en trozos pequeos. Las piezas pequeas se eliminan a travs de las incisiones, o se retiran a travs de la vagina.  Histerectoma vaginal asistida por laparoscopia (HVAL): se realizan tres o cuatro incisiones pequeas en el abdomen. Parte de la ciruga se realiza por va laparoscpica y parte por va vaginal. El tero se extirpa a travs de la vagina.  Histerectoma laparoscpica asistida por robot: se insertan un  laparoscopio y otros instrumentos quirrgicos en 3 o 4 incisiones pequeas en el abdomen. Se utiliza un dispositivo controlado por computadora para que el cirujano vea una imagen en 3D que lo ayuda a Chief Technology Officer los instrumentos quirrgicos. Esto permite movimientos ms precisos de los instrumentos quirrgicos. El tero se corta en trozos pequeos y se retira a travs de las incisiones o se elimina a travs de la vagina. RIESGOS Y Ruthven complicaciones asociadas con este procedimiento incluyen las siguientes:  Hemorragia y riesgos de Designer, industrial/product una transfusin sangunea. Infrmele al mdico si no quiere recibir ningn tipo de hemoderivados.  Cogulos de [employer] piernas o los pulmones.  Infeccin.  Lesin en los rganos circundantes.  Problemas o efectos secundarios relacionados con la anestesia.  Transformacin de cualquiera de las otras tcnicas en una histerectoma abdominal. QU ESPERAR DESPUES DE UNA HISTERECTOMA  Le darn medicamentos para el dolor.  Necesitar que alguien Nature conservation officer con usted durante los primeros 3 a 5 das despus de que regrese a Medical illustrator.  Tendr que ver al cirujano para un seguimiento 2 a 4 semanas despus de la ciruga para evaluar su progreso.  Posiblemente tenga sntomas tempranos de menopausia, como sofocos, sudoracin nocturna e insomnio.  Si le han realizado una histerectoma debido a un problema que no era cncer ni una afeccin que poda causar cncer, ya no necesitar una prueba de Papanicolaou. Sin embargo, si ya no necesita hacerse un Papanicolau, es una buena idea hacerse un examen regularmente para asegurarse de que no hay otros problemas. Esta informacin no tiene Marine scientist el consejo del mdico. Asegrese de hacerle al mdico cualquier pregunta que tenga. Document Released: 10/05/2005 Document Revised: 07/26/2013 Document  Reviewed: 06/12/2013 Elsevier Interactive Patient Education  2017 [employer].

## 2016-12-17 NOTE — Progress Notes (Signed)
   GYNECOLOGY OFFICE VISIT NOTE  History:  36 y.o. [id number] here today for follow up results after evaluation for AUB and thickened endometrium. She is on Megace 40 mg bid, reports having bleeding from 2/10-2/23. She denies any current abnormal vaginal discharge, bleeding, pelvic pain or other concerns.   Past Medical History:  Diagnosis Date  . Abnormal uterine bleeding   . Anxiety   . Hemorrhoids   . Hyperlipidemia     Past Surgical History:  Procedure Laterality Date  . HEMORRHOID SURGERY  12/12/2010   Dr Greer Pickerel; excision of rt anterior mixed column hemorrhoid, rubber band ligation of Rt lateral int hemorrhoid    The following portions of the patient's history were reviewed and updated as appropriate: allergies, current medications, past family history, past medical history, past social history, past surgical history and problem list.   Health Maintenance:  Normal pap and negative HRHPV in 2016.   Review of Systems:  Pertinent items noted in HPI and remainder of comprehensive ROS otherwise negative.   Objective:  Physical Exam BP 126/84   Pulse 85   Ht 5\' 2"  (1.575 m)   Wt 179 lb 1.6 oz (81.2 kg)   LMP 12/16/2016 (Exact Date)   BMI 32.76 kg/m  CONSTITUTIONAL: Well-developed, well-nourished female in no acute distress.  HENT:  Normocephalic, atraumatic. External right and left ear normal. Oropharynx is clear and moist EYES: Conjunctivae and EOM are normal. Pupils are equal, round, and reactive to light. No scleral icterus.  NECK: Normal range of motion, supple, no masses SKIN: Skin is warm and dry. No rash noted. Not diaphoretic. No erythema. No pallor. NEUROLOGIC: Alert and oriented to person, place, and time. Normal reflexes, muscle tone coordination. No cranial nerve deficit noted. PSYCHIATRIC: Normal mood and affect. Normal behavior. Normal judgment and thought content. CARDIOVASCULAR: Normal heart rate noted RESPIRATORY: Effort and breath sounds normal, no  problems with respiration noted ABDOMEN: Soft, no distention noted.   PELVIC: Deferred MUSCULOSKELETAL: Normal range of motion. No edema noted.  11/18/2016 Endometrium, biopsy - INACTIVE ENDOMETRIUM. - NO EVIDENCE OF HYPERPLASIA OR MALIGNANCY.  Assessment & Plan:  1. Abnormal uterine bleeding (AUB) 2. Endometrial thickening on ultra sound 3. Irregular menses Patient reassured by benign endometrial biopsy.  Discussed management options for abnormal uterine bleeding including tranexamic acid (Lysteda), oral progesterone (Megace), Depo Provera, Mirena IUD, endometrial ablation (Novasure/Hydrothermal Ablation) or hysterectomy as definitive surgical management.  Discussed risks and benefits of each method.   Patient desires to continue Megace for now at an increased dose; increased to 80 mg bid.  Bleeding precautions reviewed.  - megestrol (MEGACE) 40 MG tablet; Take 2 tablets (80 mg total) by mouth 2 (two) times daily. Can increase to 80 mg three times a day in the event of heavy bleeding  Dispense: 120 tablet; Refill: 5 Will follow up response. If AUB worsens, may need D&C for further evaluation.  Routine preventative health maintenance measures emphasized. Please refer to After Visit Summary for other counseling recommendations.   Return if symptoms worsen or fail to improve.   Total face-to-face time with patient: 15 minutes. Over 50% of encounter was spent on counseling and coordination of care.   Verita Schneiders, MD, [place] Attending [place], [facility] for [employer], [place]

## 2016-12-17 NOTE — Progress Notes (Signed)
Stratus interpreter Fabio Bering [phone].

## 2017-03-03 ENCOUNTER — Encounter: Payer: Self-pay | Admitting: Family Medicine

## 2020-04-24 ENCOUNTER — Ambulatory Visit: Payer: Self-pay | Admitting: Nurse Practitioner

## 2020-05-27 ENCOUNTER — Ambulatory Visit: Payer: Self-pay | Admitting: Family Medicine

## 2020-08-03 ENCOUNTER — Other Ambulatory Visit: Payer: Self-pay

## 2020-08-03 ENCOUNTER — Telehealth (HOSPITAL_COMMUNITY): Payer: Self-pay | Admitting: Emergency Medicine

## 2020-08-03 ENCOUNTER — Encounter (HOSPITAL_COMMUNITY): Payer: Self-pay | Admitting: Emergency Medicine

## 2020-08-03 ENCOUNTER — Ambulatory Visit (HOSPITAL_COMMUNITY)
Admission: EM | Admit: 2020-08-03 | Discharge: 2020-08-03 | Disposition: A | Discharge status: Home / Self Care | Payer: Self-pay | Attending: Family Medicine | Admitting: Family Medicine

## 2020-08-03 DIAGNOSIS — Z0189 Encounter for other specified special examinations: Secondary | ICD-10-CM | POA: Insufficient documentation

## 2020-08-03 DIAGNOSIS — Z3202 Encounter for pregnancy test, result negative: Secondary | ICD-10-CM

## 2020-08-03 DIAGNOSIS — Z862 Personal history of diseases of the blood and blood-forming organs and certain disorders involving the immune mechanism: Secondary | ICD-10-CM | POA: Insufficient documentation

## 2020-08-03 DIAGNOSIS — R300 Dysuria: Secondary | ICD-10-CM

## 2020-08-03 DIAGNOSIS — N898 Other specified noninflammatory disorders of vagina: Secondary | ICD-10-CM | POA: Insufficient documentation

## 2020-08-03 DIAGNOSIS — D649 Anemia, unspecified: Secondary | ICD-10-CM

## 2020-08-03 LAB — POCT URINALYSIS DIPSTICK, ED / UC
Bilirubin Urine: NEGATIVE
Glucose, UA: NEGATIVE mg/dL
Hgb urine dipstick: NEGATIVE
Ketones, ur: NEGATIVE mg/dL
Nitrite: NEGATIVE
Protein, ur: NEGATIVE mg/dL
Specific Gravity, Urine: 1.025 (ref 1.005–1.030)
Urobilinogen, UA: 0.2 mg/dL (ref 0.0–1.0)
pH: 7 (ref 5.0–8.0)

## 2020-08-03 LAB — CBC
HCT: 25 % — ABNORMAL LOW (ref 36.0–46.0)
Hemoglobin: 6.8 g/dL — CL (ref 12.0–15.0)
MCH: 18.7 pg — ABNORMAL LOW (ref 26.0–34.0)
MCHC: 27.2 g/dL — ABNORMAL LOW (ref 30.0–36.0)
MCV: 68.9 fL — ABNORMAL LOW (ref 80.0–100.0)
Platelets: 285 10*3/uL (ref 150–400)
RBC: 3.63 MIL/uL — ABNORMAL LOW (ref 3.87–5.11)
RDW: 19.6 % — ABNORMAL HIGH (ref 11.5–15.5)
WBC: 7.3 10*3/uL (ref 4.0–10.5)
nRBC: 0 % (ref 0.0–0.2)

## 2020-08-03 LAB — POC URINE PREG, ED: Preg Test, Ur: NEGATIVE

## 2020-08-03 MED ORDER — FLUCONAZOLE 200 MG PO TABS: 200.0000 mg | ORAL_TABLET | Freq: Once | ORAL | 0 refills | Status: AC

## 2020-08-03 MED ORDER — DOCUSATE SODIUM 100 MG PO CAPS: 100.0000 mg | ORAL_CAPSULE | Freq: Two times a day (BID) | ORAL | 0 refills | Status: DC

## 2020-08-03 NOTE — ED Triage Notes (Signed)
Pt presents with dysuria, itching, and burning xs 1-2 weeks. Also states would like blood work to check for anemia.

## 2020-08-03 NOTE — ED Provider Notes (Signed)
[facility]    CSN: [id number] Arrival date & time: 08/03/20  1008      History   Chief Complaint Chief Complaint  Patient presents with  . Dysuria  . Anemia    HPI Katherine [name] is a 39 y.o. female.   Katherine [name] presents with complaints of burning with urination. Also with vaginal itching and white vaginal discharge. No recent antibiotics. No urinary frequency. She is not diabetic. Occasional abdominal pain, none currently. LMP two weeks ago. Sexually active with 1 partner, would like std screen, no specific known exposure. Doesn't use condoms. Denies any previous similar.   Also states she is concerned about anemia, history of this. Doesn't take iron as she gets hemorrhoids. She has felt more fatigued. History of abnormal uterine bleeding. None currently.     ROS per HPI, negative if not otherwise mentioned.      Past Medical History:  Diagnosis Date  . Abnormal uterine bleeding   . Anxiety   . Hemorrhoids   . Hyperlipidemia     Patient Active Problem List   Diagnosis Date Noted  . Abnormal uterine bleeding (AUB) 12/17/2016  . Endometrial thickening on ultra sound 09/17/2016  . Chronic breast pain 08/10/2016  . Irregular menses 08/10/2016  . Vitamin D deficiency 08/10/2016  . EXTERNAL HEMORRHOIDS WITHOUT MENTION COMP 11/26/2010  . HYPERCHOLESTEROLEMIA 06/11/2009  . IDA (iron deficiency anemia) 05/28/2009    Past Surgical History:  Procedure Laterality Date  . HEMORRHOID SURGERY  12/12/2010   Dr Greer Pickerel; excision of rt anterior mixed column hemorrhoid, rubber band ligation of Rt lateral int hemorrhoid    OB History    Gravida  5   Para  4   Term  3   Preterm  1   AB  1   Living  3     SAB  1   TAB      Ectopic      Multiple      Live Births               Home Medications    Prior to Admission medications   Medication Sig Start Date End Date Taking? Authorizing Provider  acetaminophen  (TYLENOL) 500 MG tablet Take 500 mg by mouth every 6 (six) hours as needed for mild pain, moderate pain or headache.     [provider]  atorvastatin (LIPITOR) 10 MG tablet Take 1 tablet (10 mg total) by mouth daily. 08/11/16   Funches, Adriana Mccallum, MD  docusate sodium (COLACE) 100 MG capsule Take 1 capsule (100 mg total) by mouth every 12 (twelve) hours. 08/03/20   Zigmund Gottron, NP  ibuprofen (ADVIL,MOTRIN) 800 MG tablet Take 1 tablet (800 mg total) by mouth every 8 (eight) hours as needed for cramping. Patient not taking: Reported on 12/17/2016 09/17/16   Boykin Nearing, MD  megestrol (MEGACE) 40 MG tablet Take 2 tablets (80 mg total) by mouth 2 (two) times daily. Can increase to 80 mg three times a day in the event of heavy bleeding 12/17/16   Anyanwu, Sallyanne Havers, MD    Family History Family History  Problem Relation Age of Onset  . Hypertension Mother   . Diabetes Sister     Social History Social History   Tobacco Use  . Smoking status: Never Smoker  . Smokeless tobacco: Never Used  Substance Use Topics  . Alcohol use: No  . Drug use: No     Allergies   Patient has no known  allergies.   Review of Systems Review of Systems   Physical Exam Triage Vital Signs ED Triage Vitals  Enc Vitals Group     BP 08/03/20 1035 122/65     Pulse Rate 08/03/20 1035 90     Resp 08/03/20 1035 16     Temp 08/03/20 1035 99.5 F (37.5 C)     Temp Source 08/03/20 1035 Oral     SpO2 08/03/20 1035 100 %     Weight --      Height --      Head Circumference --      Peak Flow --      Pain Score 08/03/20 1034 0     Pain Loc --      Pain Edu? --      Excl. in [place]? --    No data found.  Updated Vital Signs BP 122/65 (BP Location: Left Arm)   Pulse 90   Temp 99.5 F (37.5 C) (Oral)   Resp 16   LMP 07/19/2020   SpO2 100%   Visual Acuity Right Eye Distance:   Left Eye Distance:   Bilateral Distance:    Right Eye Near:   Left Eye Near:    Bilateral Near:     Physical  Exam Constitutional:      General: She is not in acute distress.    Appearance: She is well-developed.  Cardiovascular:     Rate and Rhythm: Normal rate.  Pulmonary:     Effort: Pulmonary effort is normal.  Abdominal:     Palpations: Abdomen is not rigid.     Tenderness: There is no abdominal tenderness. There is no guarding or rebound.  Genitourinary:    Comments: Denies sores, lesions, vaginal bleeding; no pelvic pain; gu exam deferred at this time, vaginal self swab collected.   Skin:    General: Skin is warm and dry.  Neurological:     Mental Status: She is alert and oriented to person, place, and time.      UC Treatments / Results  Labs (all labs ordered are listed, but only abnormal results are displayed) Labs Reviewed  CBC - Abnormal; Notable for the following components:      Result Value   RBC 3.63 (*)    Hemoglobin 6.8 (*)    HCT 25.0 (*)    MCV 68.9 (*)    MCH 18.7 (*)    MCHC 27.2 (*)    RDW 19.6 (*)    All other components within normal limits  POCT URINALYSIS DIPSTICK, ED / UC - Abnormal; Notable for the following components:   Leukocytes,Ua TRACE (*)    All other components within normal limits  POC URINE PREG, ED  CERVICOVAGINAL ANCILLARY ONLY    EKG   Radiology No results found.  Procedures Procedures (including critical care time)  Medications Ordered in UC Medications - No data to display  Initial Impression / Assessment and Plan / UC Course  I have reviewed the triage vital signs and the nursing notes.  Pertinent labs & imaging results that were available during my care of the patient were reviewed by me and considered in my medical decision making (see chart for details).     No obvious UTI with UA today in clinic. Vaginal cytology collected and pending with diflucan provided. CBC collected, after discharge called by lab with critical HGB of 6.8. patient called and notified to present to the ER for this. Patient verbalized  understanding and agreeable to plan.  Final Clinical Impressions(s) / UC Diagnoses   Final diagnoses:  Vaginal discharge  Examination for, laboratory  History of anemia     Discharge Instructions     Your urine looks well today, I believe your symptoms are from your vagina.  We are testing your vagina, I have sent one pill today which I feel will help, but we will call you if need to provide any additional medications based on your test results.  I would recommend started Slow FE as an iron supplement. Take with colace to prevent constipation to prevent hemorrhoids/ rectal pain.  Please follow up with your primary care provider for long term management.     ED Prescriptions    Medication Sig Dispense Auth. Provider   docusate sodium (COLACE) 100 MG capsule Take 1 capsule (100 mg total) by mouth every 12 (twelve) hours. 60 capsule Augusto Gamble B, NP   fluconazole (DIFLUCAN) 200 MG tablet Take 1 tablet (200 mg total) by mouth once for 1 dose. 1 tablet Zigmund Gottron, NP     PDMP not reviewed this encounter.   Zigmund Gottron, NP 08/04/20 [phone]

## 2020-08-03 NOTE — Telephone Encounter (Signed)
Hemoglobin of 6.8. connected with patient and identified as correct patient. Recommended to go to the ER now due to hgb of 6.8. Patient verbalized understanding and agreeable to plan.

## 2020-08-03 NOTE — Discharge Instructions (Addendum)
Your urine looks well today, I believe your symptoms are from your vagina.  We are testing your vagina, I have sent one pill today which I feel will help, but we will call you if need to provide any additional medications based on your test results.  I would recommend started Slow FE as an iron supplement. Take with colace to prevent constipation to prevent hemorrhoids/ rectal pain.  Please follow up with your primary care provider for long term management.

## 2020-08-05 ENCOUNTER — Emergency Department (HOSPITAL_COMMUNITY)
Admission: EM | Admit: 2020-08-05 | Discharge: 2020-08-05 | Disposition: A | Discharge status: Home / Self Care | Payer: Self-pay | Attending: Emergency Medicine | Admitting: Emergency Medicine

## 2020-08-05 ENCOUNTER — Other Ambulatory Visit: Payer: Self-pay

## 2020-08-05 DIAGNOSIS — D509 Iron deficiency anemia, unspecified: Secondary | ICD-10-CM | POA: Insufficient documentation

## 2020-08-05 DIAGNOSIS — R519 Headache, unspecified: Secondary | ICD-10-CM | POA: Insufficient documentation

## 2020-08-05 DIAGNOSIS — R42 Dizziness and giddiness: Secondary | ICD-10-CM | POA: Insufficient documentation

## 2020-08-05 LAB — PROTIME-INR
INR: 1 (ref 0.8–1.2)
Prothrombin Time: 13 seconds (ref 11.4–15.2)

## 2020-08-05 LAB — CBC
HCT: 24.4 % — ABNORMAL LOW (ref 36.0–46.0)
Hemoglobin: 6.7 g/dL — CL (ref 12.0–15.0)
MCH: 18.9 pg — ABNORMAL LOW (ref 26.0–34.0)
MCHC: 27.5 g/dL — ABNORMAL LOW (ref 30.0–36.0)
MCV: 68.9 fL — ABNORMAL LOW (ref 80.0–100.0)
Platelets: 265 10*3/uL (ref 150–400)
RBC: 3.54 MIL/uL — ABNORMAL LOW (ref 3.87–5.11)
RDW: 19.6 % — ABNORMAL HIGH (ref 11.5–15.5)
WBC: 4.2 10*3/uL (ref 4.0–10.5)
nRBC: 0 % (ref 0.0–0.2)

## 2020-08-05 LAB — CERVICOVAGINAL ANCILLARY ONLY
Bacterial Vaginitis (gardnerella): POSITIVE — AB
Candida Glabrata: NEGATIVE
Candida Vaginitis: POSITIVE — AB
Chlamydia: NEGATIVE
Comment: NEGATIVE
Comment: NEGATIVE
Comment: NEGATIVE
Comment: NEGATIVE
Comment: NEGATIVE
Comment: NORMAL
Neisseria Gonorrhea: NEGATIVE
Trichomonas: NEGATIVE

## 2020-08-05 LAB — TYPE AND SCREEN
ABO/RH(D): O POS
Antibody Screen: NEGATIVE

## 2020-08-05 LAB — COMPREHENSIVE METABOLIC PANEL
ALT: 55 U/L — ABNORMAL HIGH (ref 0–44)
AST: 74 U/L — ABNORMAL HIGH (ref 15–41)
Albumin: 4.3 g/dL (ref 3.5–5.0)
Alkaline Phosphatase: 75 U/L (ref 38–126)
Anion gap: 10 (ref 5–15)
BUN: 7 mg/dL (ref 6–20)
CO2: 23 mmol/L (ref 22–32)
Calcium: 10.5 mg/dL — ABNORMAL HIGH (ref 8.9–10.3)
Chloride: 102 mmol/L (ref 98–111)
Creatinine, Ser: 0.77 mg/dL (ref 0.44–1.00)
GFR, Estimated: >60 mL/min (ref >60)
Glucose, Bld: 95 mg/dL (ref 70–99)
Potassium: 3.4 mmol/L — ABNORMAL LOW (ref 3.5–5.1)
Sodium: 135 mmol/L (ref 135–145)
Total Bilirubin: 0.8 mg/dL (ref 0.3–1.2)
Total Protein: 8.9 g/dL — ABNORMAL HIGH (ref 6.5–8.1)

## 2020-08-05 LAB — VITAMIN B12: Vitamin B-12: 245 pg/mL (ref 180–914)

## 2020-08-05 LAB — IRON AND TIBC
Iron: 16 ug/dL — ABNORMAL LOW (ref 28–170)
Saturation Ratios: 3 % — ABNORMAL LOW (ref 10.4–31.8)
TIBC: 582 ug/dL — ABNORMAL HIGH (ref 250–450)
UIBC: 566 ug/dL

## 2020-08-05 LAB — RETICULOCYTES
Immature Retic Fract: 13 % (ref 2.3–15.9)
RBC.: 3.79 MIL/uL — ABNORMAL LOW (ref 3.87–5.11)
Retic Count, Absolute: 36 10*3/uL (ref 19.0–186.0)
Retic Ct Pct: 1 % (ref 0.4–3.1)

## 2020-08-05 LAB — FERRITIN: Ferritin: 4 ng/mL — ABNORMAL LOW (ref 11–307)

## 2020-08-05 LAB — FOLATE: Folate: 18.5 ng/mL (ref >5.9)

## 2020-08-05 LAB — APTT: aPTT: 24 seconds (ref 24–36)

## 2020-08-05 MED ORDER — SODIUM CHLORIDE 0.9 % IV SOLN
510.0000 mg | Freq: Once | INTRAVENOUS | Status: AC
  Administered 2020-08-05: 510 mg via INTRAVENOUS
  Filled 2020-08-05: qty 510

## 2020-08-05 MED ORDER — MEDROXYPROGESTERONE ACETATE 150 MG/ML IM SUSP
300.0000 mg | Freq: Once | INTRAMUSCULAR | Status: AC
  Administered 2020-08-05: 300 mg via INTRAMUSCULAR
  Filled 2020-08-05: qty 2

## 2020-08-05 NOTE — Discharge Instructions (Signed)
You were evaluated in the Emergency Department and after careful evaluation, we did not find any emergent condition requiring admission or further testing in the hospital.  Your exam/testing today was overall reassuring.  Your symptoms seem to be due to anemia, likely due to iron deficiency.  We suspect this is all related to your menstrual cycle.  We discussed your case with Dr. Elonda Husky of the gynecology team and we provided you with IV iron here in the emergency department.  We also gave you a shot of Depo-Provera, which should eliminate any vaginal bleeding for the next 3 months.  We recommend that you follow-up with the GYN doctors closely for further management, please call the office number provided to set up an appointment.  Please return to the Emergency Department if you experience any worsening of your condition.  Thank you for allowing [place] to be a part of your care.

## 2020-08-05 NOTE — ED Triage Notes (Signed)
Patient was called and told to come here for a blood transfusion as her hemoglobin was less than 7. Patient reports headaches and difficulty moving her bowels.

## 2020-08-05 NOTE — ED Provider Notes (Signed)
[facility] Emergency Department Provider Note MRN:  [id number]  Arrival date & time: 08/05/20     Chief Complaint   Abnormal Lab   History of Present Illness   Katherine [name] is a 39 y.o. year-old female with a history of abnormal uterine bleeding presenting to the ED with chief complaint of abnormal lab.  Patient sent here from urgent care due to low hemoglobin level.  She denies any significant bleeding, no vaginal bleeding, no blood in stool or black stool, no blood thinners.  Currently endorsing mild headache and dizziness for the past several days.  Dizziness described as a lightheadedness.  Denies any numbness or weakness to the arms or legs, no chest pain or shortness of breath.  Review of Systems  A complete 10 system review of systems was obtained and all systems are negative except as noted in the HPI and PMH.   Patient's Health History    Past Medical History:  Diagnosis Date  . Abnormal uterine bleeding   . Anxiety   . Hemorrhoids   . Hyperlipidemia     Past Surgical History:  Procedure Laterality Date  . HEMORRHOID SURGERY  12/12/2010   Dr Greer Pickerel; excision of rt anterior mixed column hemorrhoid, rubber band ligation of Rt lateral int hemorrhoid    Family History  Problem Relation Age of Onset  . Hypertension Mother   . Diabetes Sister     Social History   Socioeconomic History  . Marital status: Married    Spouse name: Not on file  . Number of children: Not on file  . Years of education: Not on file  . Highest education level: Not on file  Occupational History  . Not on file  Tobacco Use  . Smoking status: Never Smoker  . Smokeless tobacco: Never Used  Substance and Sexual Activity  . Alcohol use: No  . Drug use: No  . Sexual activity: Yes    Birth control/protection: Pill  Other Topics Concern  . Not on file  Social History Narrative  . Not on file   Social Determinants of Health   Financial Resource  Strain:   . Difficulty of Paying Living Expenses: Not on file  Food Insecurity:   . Worried About [place] in the Last Year: Not on file  . Ran Out of Food in the Last Year: Not on file  Transportation Needs:   . Lack of Transportation (Medical): Not on file  . Lack of Transportation (Non-Medical): Not on file  Physical Activity:   . Days of Exercise per Week: Not on file  . Minutes of Exercise per Session: Not on file  Stress:   . Feeling of Stress : Not on file  Social Connections:   . Frequency of Communication with Friends and Family: Not on file  . Frequency of Social Gatherings with Friends and Family: Not on file  . Attends Religious Services: Not on file  . Active Member of Clubs or Organizations: Not on file  . Attends Archivist Meetings: Not on file  . Marital Status: Not on file  Intimate Partner Violence:   . Fear of Current or Ex-Partner: Not on file  . Emotionally Abused: Not on file  . Physically Abused: Not on file  . Sexually Abused: Not on file     Physical Exam   Vitals:   08/05/20 1858 08/05/20 2000  BP: 125/74 121/74  Pulse: 81 88  Resp: (!) 22 20  Temp:  99.6 F (37.6 C)   SpO2: 100% 100%    CONSTITUTIONAL: Well-appearing, NAD NEURO:  Alert and oriented x 3, no focal deficits EYES:  eyes equal and reactive ENT/NECK:  no LAD, no JVD CARDIO: Regular rate, well-perfused, normal S1 and S2 PULM:  CTAB no wheezing or rhonchi GI/GU:  normal bowel sounds, non-distended, non-tender MSK/SPINE:  No gross deformities, no edema SKIN:  no rash, atraumatic PSYCH:  Appropriate speech and behavior  *Additional and/or pertinent findings included in MDM below  Diagnostic and Interventional Summary    EKG Interpretation  Date/Time:  Monday August 05 2020 17:09:23 EDT Ventricular Rate:  96 PR Interval:    QRS Duration: 76 QT Interval:  345 QTC Calculation: 436 R Axis:   47 Text Interpretation: Sinus rhythm Abnormal R-wave  progression, early transition Borderline T wave abnormalities 12 Lead; [place] No previous ECGs available Confirmed by Gerlene Fee [phone]) on 08/05/2020 6:13:50 PM      Labs Reviewed  CBC - Abnormal; Notable for the following components:      Result Value   RBC 3.54 (*)    Hemoglobin 6.7 (*)    HCT 24.4 (*)    MCV 68.9 (*)    MCH 18.9 (*)    MCHC 27.5 (*)    RDW 19.6 (*)    All other components within normal limits  COMPREHENSIVE METABOLIC PANEL - Abnormal; Notable for the following components:   Potassium 3.4 (*)    Calcium 10.5 (*)    Total Protein 8.9 (*)    AST 74 (*)    ALT 55 (*)    All other components within normal limits  IRON AND TIBC - Abnormal; Notable for the following components:   Iron 16 (*)    TIBC 582 (*)    Saturation Ratios 3 (*)    All other components within normal limits  FERRITIN - Abnormal; Notable for the following components:   Ferritin 4 (*)    All other components within normal limits  RETICULOCYTES - Abnormal; Notable for the following components:   RBC. 3.79 (*)    All other components within normal limits  PROTIME-INR  APTT  VITAMIN B12  FOLATE  TYPE AND SCREEN  ABO/RH    No orders to display    Medications  ferumoxytol (FERAHEME) 510 mg in sodium chloride 0.9 % 100 mL IVPB (0 mg Intravenous Stopped 08/05/20 2051)  medroxyPROGESTERone (DEPO-PROVERA) injection 300 mg (300 mg Intramuscular Given 08/05/20 2031)     Procedures  /  Critical Care Procedures  ED Course and Medical Decision Making  I have reviewed the triage vital signs, the nursing notes, and pertinent available records from the EMR.  Listed above are laboratory and imaging tests that I personally ordered, reviewed, and interpreted and then considered in my medical decision making (see below for details).  Anemia unclear etiology, history of abnormal uterine bleeding per EMR.  Awaiting repeat labs here in the ED.     Repeat labs here confirm hemoglobin of 6.7  with an MCV in the 60s, anemia panel supporting diagnosis of iron deficiency anemia.  Patient denies any GI bleeding, no bright red blood, no black stool, given her long history of abnormal and heavy periods this is the likely cause.  Overall I suspect this is a chronic issue and we have no hemoglobin comparison since 2017.  She is not orthostatic or lightheaded when she stands up, her heart rate at rest is in the 70s to 80s.  Transfusion was  still offered but after hearing of the risks and benefits patient asked if there are any other options.  Discussed case with Dr. Excell Seltzer of GYN, who recommends iron infusion, large dose Depo-Provera injection, and follow-up in the office.  Appropriate for discharge.  Barth Kirks. Sedonia Small, MD [place] [email or web address]  Final Clinical Impressions(s) / ED Diagnoses     ICD-10-CM   1. Iron deficiency anemia, unspecified iron deficiency anemia type  D50.9     ED Discharge Orders    None       Discharge Instructions Discussed with and Provided to Patient:     Discharge Instructions     You were evaluated in the Emergency Department and after careful evaluation, we did not find any emergent condition requiring admission or further testing in the hospital.  Your exam/testing today was overall reassuring.  Your symptoms seem to be due to anemia, likely due to iron deficiency.  We suspect this is all related to your menstrual cycle.  We discussed your case with Dr. Elonda Husky of the gynecology team and we provided you with IV iron here in the emergency department.  We also gave you a shot of Depo-Provera, which should eliminate any vaginal bleeding for the next 3 months.  We recommend that you follow-up with the GYN doctors closely for further management, please call the office number provided to set up an appointment.  Please return to the Emergency Department if you experience any worsening of your condition.  Thank you  for allowing [place] to be a part of your care.        Maudie Flakes, MD 08/05/20 2117

## 2020-08-06 ENCOUNTER — Other Ambulatory Visit: Payer: Self-pay

## 2020-08-06 ENCOUNTER — Encounter (HOSPITAL_COMMUNITY): Payer: Self-pay

## 2020-08-06 ENCOUNTER — Telehealth (HOSPITAL_COMMUNITY): Payer: Self-pay | Admitting: Emergency Medicine

## 2020-08-06 ENCOUNTER — Ambulatory Visit (HOSPITAL_COMMUNITY)
Admission: EM | Admit: 2020-08-06 | Discharge: 2020-08-06 | Disposition: A | Discharge status: Home / Self Care | Payer: Self-pay | Attending: Internal Medicine | Admitting: Internal Medicine

## 2020-08-06 DIAGNOSIS — Z0189 Encounter for other specified special examinations: Secondary | ICD-10-CM

## 2020-08-06 DIAGNOSIS — U071 COVID-19: Secondary | ICD-10-CM | POA: Insufficient documentation

## 2020-08-06 DIAGNOSIS — Z20822 Contact with and (suspected) exposure to covid-19: Secondary | ICD-10-CM

## 2020-08-06 MED ORDER — METRONIDAZOLE 500 MG PO TABS: 500.0000 mg | ORAL_TABLET | Freq: Two times a day (BID) | ORAL | 0 refills | Status: DC

## 2020-08-06 NOTE — ED Triage Notes (Signed)
Pt presents for COVID testing. Pt denies fever, shortness of breath, cough, or any other symptoms.    

## 2020-08-07 ENCOUNTER — Telehealth (HOSPITAL_COMMUNITY): Payer: Self-pay

## 2020-08-07 LAB — SARS CORONAVIRUS 2 (TAT 6-24 HRS): SARS Coronavirus 2: POSITIVE — AB

## 2020-08-07 NOTE — Telephone Encounter (Signed)
Called to Discuss with patient about Covid symptoms and the use of the monoclonal antibody infusion for those with mild to moderate Covid symptoms and at a high risk of hospitalization.     Pt appears to qualify for this infusion due to co-morbid conditions and/or a member of an at-risk group in accordance with the FDA Emergency Use Authorization.    Unable to reach pt. Left voicemail message in Spanish to return phone call at [phone].

## 2020-08-07 NOTE — Telephone Encounter (Signed)
Called to Discuss with patient about Covid symptoms and the use of the monoclonal antibody infusion for those with mild to moderate Covid symptoms and at a high risk of hospitalization.     Pt is qualified for this infusion due to co-morbid conditions and/or a member of an at-risk group.     Patient Active Problem List   Diagnosis Date Noted  . Abnormal uterine bleeding (AUB) 12/17/2016  . Endometrial thickening on ultra sound 09/17/2016  . Chronic breast pain 08/10/2016  . Irregular menses 08/10/2016  . Vitamin D deficiency 08/10/2016  . EXTERNAL HEMORRHOIDS WITHOUT MENTION COMP 11/26/2010  . HYPERCHOLESTEROLEMIA 06/11/2009  . IDA (iron deficiency anemia) 05/28/2009    Patient declines infusion at this time. Patient states she is not having symptoms and tested for COVID due to exposure.   Symptoms tier reviewed as well as criteria for ending isolation. Preventative practices reviewed. Patient verbalized understanding.    Patient advised to call back if he/she decides that he/she does want to get infusion. Callback number to the infusion center given. Patient advised to go to Urgent care or ED with severe symptoms. Conversation was in [place] and patient verbalized understanding.

## 2020-08-19 ENCOUNTER — Other Ambulatory Visit: Payer: Self-pay

## 2020-08-19 ENCOUNTER — Encounter: Payer: Self-pay | Admitting: Obstetrics & Gynecology

## 2020-08-19 ENCOUNTER — Ambulatory Visit (INDEPENDENT_AMBULATORY_CARE_PROVIDER_SITE_OTHER): Payer: Self-pay | Admitting: Obstetrics & Gynecology

## 2020-08-19 VITALS — BP 118/81 | HR 74 | Wt 171.0 lb

## 2020-08-19 DIAGNOSIS — D509 Iron deficiency anemia, unspecified: Secondary | ICD-10-CM

## 2020-08-19 DIAGNOSIS — N921 Excessive and frequent menstruation with irregular cycle: Secondary | ICD-10-CM

## 2020-08-19 LAB — POCT HEMOGLOBIN: Hemoglobin: 9.7 g/dL — AB (ref 11–14.6)

## 2020-08-19 MED ORDER — PREDNISONE 10 MG PO TABS: ORAL_TABLET | ORAL | 0 refills | Status: DC

## 2020-08-19 MED ORDER — DESOGESTREL-ETHINYL ESTRADIOL 0.15-30 MG-MCG PO TABS: 1.0000 | ORAL_TABLET | Freq: Every day | ORAL | 11 refills | Status: DC

## 2020-08-19 NOTE — Progress Notes (Signed)
Chief Complaint  Patient presents with  . Follow-up    ER visit for anemia      39 y.o. Katherine [name] Patient's last menstrual period was 07/19/2020. The current method of family planning is none.  Outpatient Encounter Medications as of 08/19/2020  Medication Sig  . ferrous sulfate 325 (65 FE) MG tablet Take 325 mg by mouth daily with breakfast.  . atorvastatin (LIPITOR) 10 MG tablet Take 1 tablet (10 mg total) by mouth daily. (Patient not taking: Reported on 08/05/2020)  . desogestrel-ethinyl estradiol (APRI) 0.15-30 MG-MCG tablet Take 1 tablet by mouth daily.  Marland Kitchen docusate sodium (COLACE) 100 MG capsule Take 1 capsule (100 mg total) by mouth every 12 (twelve) hours. (Patient not taking: Reported on 08/19/2020)  . ibuprofen (ADVIL,MOTRIN) 800 MG tablet Take 1 tablet (800 mg total) by mouth every 8 (eight) hours as needed for cramping. (Patient not taking: Reported on 12/17/2016)  . megestrol (MEGACE) 40 MG tablet Take 2 tablets (80 mg total) by mouth 2 (two) times daily. Can increase to 80 mg three times a day in the event of heavy bleeding (Patient not taking: Reported on 08/05/2020)  . predniSONE (DELTASONE) 10 MG tablet Take 4 tablets all at once daily for 10 days  . [DISCONTINUED] metroNIDAZOLE (FLAGYL) 500 MG tablet Take 1 tablet (500 mg total) by mouth 2 (two) times daily. (Patient not taking: Reported on 08/19/2020)   No facility-administered encounter medications on file as of 08/19/2020.    Subjective Pt with long history of heavy menses and iron deficiency anemia Seen in ED received feraheme + depo provera 300 mg IM at my direction  Here for follow up Bleeding stopped a couple days after getting the depo provera shot  Hemoglobin now 9.8  Past Medical History:  Diagnosis Date  . Abnormal uterine bleeding   . Anemia   . Anxiety   . Hemorrhoids   . Hyperlipidemia     Past Surgical History:  Procedure Laterality Date  . HEMORRHOID SURGERY  12/12/2010   Dr Greer Pickerel;  excision of rt anterior mixed column hemorrhoid, rubber band ligation of Rt lateral int hemorrhoid    OB History    Gravida  5   Para  4   Term  3   Preterm  1   AB  1   Living  3     SAB  1   TAB      Ectopic      Multiple      Live Births              No Known Allergies  Social History   Socioeconomic History  . Marital status: Married    Spouse name: Not on file  . Number of children: Not on file  . Years of education: Not on file  . Highest education level: Not on file  Occupational History  . Not on file  Tobacco Use  . Smoking status: Never Smoker  . Smokeless tobacco: Never Used  Vaping Use  . Vaping Use: Never used  Substance and Sexual Activity  . Alcohol use: No  . Drug use: No  . Sexual activity: Yes    Birth control/protection: None  Other Topics Concern  . Not on file  Social History Narrative  . Not on file   Social Determinants of Health   Financial Resource Strain:   . Difficulty of Paying Living Expenses: Not on file  Food Insecurity:   . Worried About  Running Out of Food in the Last Year: Not on file  . Ran Out of Food in the Last Year: Not on file  Transportation Needs:   . Lack of Transportation (Medical): Not on file  . Lack of Transportation (Non-Medical): Not on file  Physical Activity:   . Days of Exercise per Week: Not on file  . Minutes of Exercise per Session: Not on file  Stress:   . Feeling of Stress : Not on file  Social Connections:   . Frequency of Communication with Friends and Family: Not on file  . Frequency of Social Gatherings with Friends and Family: Not on file  . Attends Religious Services: Not on file  . Active Member of Clubs or Organizations: Not on file  . Attends Archivist Meetings: Not on file  . Marital Status: Not on file    Family History  Problem Relation Age of Onset  . Hypertension Mother   . Diabetes Sister     Medications:       Current Outpatient Medications:  .   ferrous sulfate 325 (65 FE) MG tablet, Take 325 mg by mouth daily with breakfast., Disp: , Rfl:  .  atorvastatin (LIPITOR) 10 MG tablet, Take 1 tablet (10 mg total) by mouth daily. (Patient not taking: Reported on 08/05/2020), Disp: 30 tablet, Rfl: 11 .  desogestrel-ethinyl estradiol (APRI) 0.15-30 MG-MCG tablet, Take 1 tablet by mouth daily., Disp: 28 tablet, Rfl: 11 .  docusate sodium (COLACE) 100 MG capsule, Take 1 capsule (100 mg total) by mouth every 12 (twelve) hours. (Patient not taking: Reported on 08/19/2020), Disp: 60 capsule, Rfl: 0 .  ibuprofen (ADVIL,MOTRIN) 800 MG tablet, Take 1 tablet (800 mg total) by mouth every 8 (eight) hours as needed for cramping. (Patient not taking: Reported on 12/17/2016), Disp: 60 tablet, Rfl: 2 .  megestrol (MEGACE) 40 MG tablet, Take 2 tablets (80 mg total) by mouth 2 (two) times daily. Can increase to 80 mg three times a day in the event of heavy bleeding (Patient not taking: Reported on 08/05/2020), Disp: 120 tablet, Rfl: 5 .  predniSONE (DELTASONE) 10 MG tablet, Take 4 tablets all at once daily for 10 days, Disp: 40 tablet, Rfl: 0  Objective Blood pressure 118/81, pulse 74, weight 171 lb (77.6 kg), last menstrual period 07/19/2020.  Gen WDWN NAD  Pertinent ROS No burning with urination, frequency or urgency No nausea, vomiting or diarrhea Nor fever chills or other constitutional symptoms   Labs or studies reviewed    Impression Diagnoses this Encounter::   ICD-10-CM   1. Menometrorrhagia  N92.1   2. Iron deficiency anemia, unspecified iron deficiency anemia type  D50.9 POCT hemoglobin    Established relevant diagnosis(es):   Plan/Recommendations: Meds ordered this encounter  Medications  . desogestrel-ethinyl estradiol (APRI) 0.15-30 MG-MCG tablet    Sig: Take 1 tablet by mouth daily.    Dispense:  28 tablet    Refill:  11  . predniSONE (DELTASONE) 10 MG tablet    Sig: Take 4 tablets all at once daily for 10 days    Dispense:  40  tablet    Refill:  0    Labs or Scans Ordered: Orders Placed This Encounter  Procedures  . POCT hemoglobin    Management:: depoo provera has about 2+ months of effectiveness Begin OCP in 1 month Prednisone for itching Received feraheme x 1  Follow up No follow-ups on file.        Face to face  time:  20 minutes  Greater than 50% of the visit time was spent in counseling and coordination of care with the patient.  The summary and outline of the counseling and care coordination is summarized in the note above.   All questions were answered.

## 2020-11-19 ENCOUNTER — Ambulatory Visit (INDEPENDENT_AMBULATORY_CARE_PROVIDER_SITE_OTHER): Payer: Self-pay | Admitting: Obstetrics & Gynecology

## 2020-11-19 ENCOUNTER — Encounter: Payer: Self-pay | Admitting: Obstetrics & Gynecology

## 2020-11-19 ENCOUNTER — Other Ambulatory Visit: Payer: Self-pay

## 2020-11-19 VITALS — BP 144/95 | HR 89 | Ht 62.0 in | Wt 175.0 lb

## 2020-11-19 DIAGNOSIS — N921 Excessive and frequent menstruation with irregular cycle: Secondary | ICD-10-CM

## 2020-11-19 DIAGNOSIS — D509 Iron deficiency anemia, unspecified: Secondary | ICD-10-CM

## 2020-11-19 DIAGNOSIS — D649 Anemia, unspecified: Secondary | ICD-10-CM

## 2020-11-19 LAB — POCT HEMOGLOBIN: Hemoglobin: 7.5 g/dL — AB (ref 11–14.6)

## 2020-11-19 MED ORDER — MEDROXYPROGESTERONE ACETATE 150 MG/ML IM SUSP: 150.0000 mg | INTRAMUSCULAR | 3 refills | Status: DC

## 2020-11-19 NOTE — Progress Notes (Signed)
Follow up appointment for response: to [place] therapy  Chief Complaint  Patient presents with   Menorrhagia    Blood pressure (!) 144/95, pulse 89, height 5\' 2"  (1.575 m), weight 175 lb (79.4 kg).    Bleeding overall much better but still has heavy 2-3 days with clots and pain Bleeds for about 1 week on the COC Wants to retry the depo provera  MEDS ordered this encounter: Meds ordered this encounter  Medications   medroxyPROGESTERone (DEPO-PROVERA) 150 MG/ML injection    Sig: Inject 1 mL (150 mg total) into the muscle every 3 (three) months.    Dispense:  1 mL    Refill:  3    Orders for this encounter: Orders Placed This Encounter  Procedures   POCT hemoglobin    Impression:   ICD-10-CM   1. Menometrorrhagia  N92.1   2. Anemia, unspecified type  D64.9 POCT hemoglobin  3. Iron deficiency anemia, unspecified iron deficiency anemia type  D50.9      Plan: Meds ordered this encounter  Medications   medroxyPROGESTERone (DEPO-PROVERA) 150 MG/ML injection    Sig: Inject 1 mL (150 mg total) into the muscle every 3 (three) months.    Dispense:  1 mL    Refill:  3     Follow Up: Return in about 6 months (around 05/19/2021) for Follow up, with Dr Elonda Husky.       Face to face time:  20 miinutes  Greater than 50% of the visit time was spent in counseling and coordination of care with the patient.  The summary and outline of the counseling and care coordination is summarized in the note above.   All questions were answered.  Past Medical History:  Diagnosis Date   Abnormal uterine bleeding    Anemia    Anxiety    Hemorrhoids    Hyperlipidemia     Past Surgical History:  Procedure Laterality Date   HEMORRHOID SURGERY  12/12/2010   Dr Greer Pickerel; excision of rt anterior mixed column hemorrhoid, rubber band ligation of Rt lateral int hemorrhoid    OB History    Gravida  5   Para  4   Term  3   Preterm  1   AB  1   Living  3     SAB  1   IAB       Ectopic      Multiple      Live Births              No Known Allergies  Social History   Socioeconomic History   Marital status: Married    Spouse name: Not on file   Number of children: Not on file   Years of education: Not on file   Highest education level: Not on file  Occupational History   Not on file  Tobacco Use   Smoking status: Never Smoker   Smokeless tobacco: Never Used  Vaping Use   Vaping Use: Never used  Substance and Sexual Activity   Alcohol use: No   Drug use: No   Sexual activity: Yes    Birth control/protection: None  Other Topics Concern   Not on file  Social History Narrative   Not on file   Social Determinants of Health   Financial Resource Strain: Low Risk    Difficulty of Paying Living Expenses: Not hard at all  Food Insecurity: No Food Insecurity   Worried About [place] in the Last  Year: Never true   [place] in the Last Year: Never true  Transportation Needs: No Transportation Needs   Lack of Transportation (Medical): No   Lack of Transportation (Non-Medical): No  Physical Activity: Insufficiently Active   Days of Exercise per Week: 1 day   Minutes of Exercise per Session: 50 min  Stress: No Stress Concern Present   Feeling of Stress : Only a little  Social Connections: Socially Isolated   Frequency of Communication with Friends and Family: Once a week   Frequency of Social Gatherings with Friends and Family: Never   Attends Religious Services: Never   Marine scientist or Organizations: No   Attends Archivist Meetings: Never   Marital Status: Married    Family History  Problem Relation Age of Onset   Hypertension Mother    Diabetes Sister

## 2020-11-20 ENCOUNTER — Other Ambulatory Visit: Payer: Self-pay | Admitting: Obstetrics & Gynecology

## 2020-11-20 ENCOUNTER — Telehealth: Payer: Self-pay | Admitting: Obstetrics & Gynecology

## 2020-11-20 MED ORDER — MEGESTROL ACETATE 40 MG PO TABS: ORAL_TABLET | ORAL | 11 refills | Status: DC

## 2020-11-20 NOTE — Telephone Encounter (Signed)
[place]  Pt must have changed her mind, I did not misunderstand which is fine, but she clearly chose the shot prior to leaving yesterday  The requested change has been made :)

## 2020-11-20 NOTE — Telephone Encounter (Signed)
Patient called stating that she was given a medication by Dr. Elonda Husky in injection form but she would like it in pill form instead. Patient would like the medication sent to the [facility] in [place]. Patient does not speak english but her husband does. Please contact husband when medication has been sent.

## 2020-11-20 NOTE — Telephone Encounter (Signed)
Pt thought she was getting an oral medication for bleeding but when she went to the pharmacy they gave her depo. She wants pills sent in, doesn't want to take a shot. Will route to Dr. Elonda Husky for his advice.

## 2020-11-21 NOTE — Telephone Encounter (Signed)
Spoke with patients husband and informed him med change made and was sent to pharmacy.

## 2020-11-26 ENCOUNTER — Telehealth: Payer: Self-pay | Admitting: Obstetrics & Gynecology

## 2020-11-26 ENCOUNTER — Other Ambulatory Visit: Payer: Self-pay

## 2020-11-26 MED ORDER — DESOGESTREL-ETHINYL ESTRADIOL 0.15-30 MG-MCG PO TABS
1.0000 | ORAL_TABLET | Freq: Every day | ORAL | 11 refills | Status: DC
Start: 2020-11-26 — End: 2021-08-21

## 2020-11-26 MED ORDER — MEGESTROL ACETATE 40 MG PO TABS: ORAL_TABLET | ORAL | 11 refills | Status: DC

## 2020-11-26 NOTE — Telephone Encounter (Signed)
Pt's husband called to check on refill megestrol (MEGACE) 40 MG tablet & pt prefers birth control pill, can we order desogestrel-ethinyl estradiol (APRI) 0.15-30 MG-MCG tablet    Please advise & call pt     [facility] @ [place]

## 2021-01-07 ENCOUNTER — Encounter: Payer: Self-pay | Admitting: Family Medicine

## 2021-01-07 ENCOUNTER — Ambulatory Visit: Payer: Self-pay | Attending: Family Medicine | Admitting: Family Medicine

## 2021-01-07 ENCOUNTER — Other Ambulatory Visit: Payer: Self-pay

## 2021-01-07 VITALS — BP 123/77 | HR 91 | Ht 62.0 in | Wt 176.6 lb

## 2021-01-07 DIAGNOSIS — Z0000 Encounter for general adult medical examination without abnormal findings: Secondary | ICD-10-CM

## 2021-01-07 DIAGNOSIS — Z13228 Encounter for screening for other metabolic disorders: Secondary | ICD-10-CM

## 2021-01-07 DIAGNOSIS — Z1231 Encounter for screening mammogram for malignant neoplasm of breast: Secondary | ICD-10-CM

## 2021-01-07 NOTE — Patient Instructions (Signed)
Mantenimiento de Technical sales engineer en [place] Maintenance, Female Adoptar un estilo de vida saludable y recibir atencin preventiva son importantes para promover la salud y Musician. Consulte al mdico sobre:  El esquema adecuado para hacerse pruebas y exmenes peridicos.  Cosas que puede hacer por su cuenta para prevenir enfermedades y [employer]. Qu debo saber sobre la dieta, el peso y el ejercicio? Consuma una dieta saludable  Consuma una dieta que incluya muchas verduras, frutas, productos lcteos con bajo contenido de [place] y Advertising account planner.  No consuma muchos alimentos ricos en grasas slidas, azcares agregados o sodio.   Mantenga un peso saludable El ndice de masa muscular [facility]) se [place] para identificar problemas de [place]. Proporciona una estimacin de la grasa corporal basndose en el peso y la altura. Su mdico puede ayudarle a Radiation protection practitioner [place] y a Scientist, forensic o Theatre manager un peso saludable. Haga ejercicio con regularidad Haga ejercicio con regularidad. Esta es una de las prcticas ms importantes que puede hacer por su salud. La mayora de los adultos deben seguir estas pautas:  Optometrist, al menos, 133minutos de actividad fsica por semana. El ejercicio debe aumentar la frecuencia cardaca y Nature conservation officer transpirar (ejercicio de intensidad moderada).  Hacer ejercicios de fortalecimiento por lo [employer] por semana. Agregue esto a su plan de ejercicio de intensidad moderada.  Pasar menos tiempo sentados. Incluso la actividad fsica ligera puede ser beneficiosa. Controle sus niveles de colesterol y lpidos en la sangre Comience a realizarse anlisis de lpidos y Research officer, trade union en la sangre a los 20aos y luego reptalos cada 5aos. Hgase controlar los niveles de colesterol con mayor frecuencia si:  Sus niveles de lpidos y colesterol son altos.  Es mayor de 40aos.  Presenta un alto riesgo de padecer enfermedades cardacas. Qu debo saber sobre las pruebas de  deteccin del cncer? Segn su historia clnica y sus antecedentes familiares, es posible que deba realizarse pruebas de deteccin del cncer en diferentes edades. Esto puede incluir pruebas de deteccin de lo siguiente:  Cncer de mama.  Cncer de cuello uterino.  Cncer colorrectal.  Cncer de piel.  Cncer de pulmn. Qu debo saber sobre la enfermedad cardaca, la diabetes y la hipertensin arterial? Presin arterial y enfermedad cardaca  La hipertensin arterial causa enfermedades cardacas y [place] el riesgo de accidente cerebrovascular. Es ms probable que esto se manifieste en las personas que tienen lecturas de presin arterial alta, tienen ascendencia africana o tienen sobrepeso.  Hgase controlar la presin arterial: ? Cada 3 a 5 aos si tiene entre 18 y 63 aos. ? Todos los aos si es mayor de [place]. Diabetes Realcese exmenes de deteccin de la diabetes con regularidad. Este anlisis revisa el nivel de azcar en la sangre en [place]. Hgase las pruebas de deteccin:  Cada tresaos despus de los 52aos de edad si tiene un peso normal y un bajo riesgo de padecer diabetes.  Con ms frecuencia y a partir de [place] edad inferior si tiene sobrepeso o un alto riesgo de padecer diabetes. Qu debo saber sobre la prevencin de infecciones? Hepatitis B Si tiene un riesgo ms alto de contraer hepatitis B, debe someterse a un examen de deteccin de este virus. Hable con el mdico para averiguar si tiene riesgo de contraer la infeccin por hepatitis B. Hepatitis C Se recomienda el anlisis a:  [employer] 1945 y 1965.  Todas las personas que tengan un riesgo de haber contrado hepatitis C. Enfermedades de transmisin sexual (ETS)  Hgase  las pruebas de deteccin de ITS, incluidas la gonorrea y la clamidia, si: ? Es sexualmente activa y es menor de [place]. ? Es mayor de 24aos, y Investment banker, operational informa que corre riesgo de tener este tipo de  infecciones. ? La actividad sexual ha cambiado desde que le hicieron la ltima prueba de deteccin y tiene un riesgo mayor de Best boy clamidia o Radio broadcast assistant. Pregntele al mdico si usted tiene riesgo.  Pregntele al mdico si usted tiene un alto riesgo de Museum/gallery curator VIH. El mdico tambin puede recomendarle un medicamento recetado para ayudar a evitar la infeccin por el VIH. Si elige tomar medicamentos para prevenir el VIH, primero debe [employer] de deteccin del VIH. Luego debe hacerse anlisis cada 6meses mientras est tomando los medicamentos. Embarazo  Si est por dejar de Librarian, academic (fase premenopusica) y usted puede quedar [place], busque asesoramiento antes de [place].  Tome de 400 a [id number] (mcg) de cido [employer] si [place].  Pida mtodos de control de la natalidad (anticonceptivos) si desea evitar un embarazo no deseado. Osteoporosis y [place] La osteoporosis es una enfermedad en la que los huesos pierden los minerales y la fuerza por el avance de la edad. El resultado pueden ser fracturas en los [place]. Si tiene 65aos o ms, o si est en riesgo de sufrir osteoporosis y fracturas, pregunte a su mdico si debe:  Hacerse pruebas de deteccin de prdida sea.  Tomar un suplemento de calcio o de vitamina D para reducir el riesgo de fracturas.  Recibir terapia de reemplazo hormonal (TRH) para tratar los sntomas de la menopausia. Siga estas instrucciones en su casa: Estilo de vida  No consuma ningn producto que contenga nicotina o tabaco, como cigarrillos, cigarrillos electrnicos y tabaco de Higher education careers adviser. Si necesita ayuda para dejar de fumar, consulte al mdico.  No consuma drogas.  No comparta agujas.  Solicite ayuda a su mdico si necesita apoyo o informacin para abandonar las drogas. Consumo de alcohol  No beba alcohol si: ? Su mdico le indica no hacerlo. ? Est embarazada, puede estar embarazada o est tratando de quedar  embarazada.  Si bebe alcohol: ? Limite la cantidad que consume de 0 a 1 medida por da. ? Limite la ingesta si est amamantando.  Est atento a la cantidad de alcohol que hay en las bebidas que toma. En los [place], una medida equivale a una botella de cerveza de 12oz (362ml), un vaso de vino de 5oz (175ml) o un vaso de una bebida alcohlica de alta graduacin de 1oz (56ml). Instrucciones generales  Realcese los estudios de rutina de la salud, dentales y de Public librarian.  [place].  Infrmele a su mdico si: ? Se siente deprimida con frecuencia. ? Alguna vez ha sido vctima de [place] o no se siente segura en su casa. Resumen  Adoptar un estilo de vida saludable y recibir atencin preventiva son importantes para promover la salud y Musician.  Siga las instrucciones del mdico acerca de una dieta saludable, el ejercicio y la realizacin de pruebas o exmenes para Engineer, building services.  Siga las instrucciones del mdico con respecto al control del colesterol y la presin arterial. Esta informacin no tiene Marine scientist el consejo del mdico. Asegrese de hacerle al mdico cualquier pregunta que tenga. Document Revised: 10/26/2018 Document Reviewed: 10/26/2018 Elsevier Patient Education  [place].

## 2021-01-07 NOTE — Progress Notes (Signed)
Subjective:  Patient ID: Katherine [name], female    DOB: 23-Oct-1980  Age: 40 y.o. MRN: [id number]  CC: No chief complaint on file.   HPI Katherine [name] presents for a complete physical exam.  She brings in paperwork from [place] [employer] regarding the need for examination by a Location manager.  I have advised her of the fact that the list of [employer] several surgeons can be obtained online and appointment made as well and examination paperwork cannot be completed by just any physician except designated as a [place] [employer] civil surgeon.  She is due for mammogram and Pap smear but is currently on her monthly cycle. Past Medical History:  Diagnosis Date  . Abnormal uterine bleeding   . Anemia   . Anxiety   . Hemorrhoids   . Hyperlipidemia     Past Surgical History:  Procedure Laterality Date  . HEMORRHOID SURGERY  12/12/2010   Dr Greer Pickerel; excision of rt anterior mixed column hemorrhoid, rubber band ligation of Rt lateral int hemorrhoid    Family History  Problem Relation Age of Onset  . Hypertension Mother   . Diabetes Sister     No Known Allergies  Outpatient Medications Prior to Visit  Medication Sig Dispense Refill  . desogestrel-ethinyl estradiol (APRI) 0.15-30 MG-MCG tablet Take 1 tablet by mouth daily. 28 tablet 11  . atorvastatin (LIPITOR) 10 MG tablet Take 1 tablet (10 mg total) by mouth daily. (Patient not taking: No sig reported) 30 tablet 11  . docusate sodium (COLACE) 100 MG capsule Take 1 capsule (100 mg total) by mouth every 12 (twelve) hours. (Patient not taking: No sig reported) 60 capsule 0  . ferrous sulfate 325 (65 FE) MG tablet Take 325 mg by mouth daily with breakfast. (Patient not taking: No sig reported)    . ibuprofen (ADVIL,MOTRIN) 800 MG tablet Take 1 tablet (800 mg total) by mouth every 8 (eight) hours as needed for cramping. (Patient not taking: No sig reported) 60 tablet 2  . medroxyPROGESTERone (DEPO-PROVERA) 150 MG/ML injection Inject 1 mL (150 mg  total) into the muscle every 3 (three) months. (Patient not taking: Reported on 01/07/2021) 1 mL 3  . megestrol (MEGACE) 40 MG tablet Take 2 tablets (80 mg total) by mouth 2 (two) times daily. Can increase to 80 mg three times a day in the event of heavy bleeding (Patient not taking: No sig reported) 120 tablet 5  . megestrol (MEGACE) 40 MG tablet 3 tablets a day for 5 days, 2 tablets a day for 5 days then 1 tablet daily (Patient not taking: Reported on 01/07/2021) 45 tablet 11  . predniSONE (DELTASONE) 10 MG tablet Take 4 tablets all at once daily for 10 days (Patient not taking: No sig reported) 40 tablet 0   No facility-administered medications prior to visit.     ROS Review of Systems  Constitutional: Negative for activity change, appetite change and fatigue.  HENT: Negative for congestion, sinus pressure and sore throat.   Eyes: Negative for visual disturbance.  Respiratory: Negative for cough, chest tightness, shortness of breath and wheezing.   Cardiovascular: Negative for chest pain and palpitations.  Gastrointestinal: Negative for abdominal distention, abdominal pain and constipation.  Endocrine: Negative for polydipsia.  Genitourinary: Negative for dysuria and frequency.  Musculoskeletal: Negative for arthralgias and back pain.  Skin: Negative for rash.  Neurological: Negative for tremors, light-headedness and numbness.  Hematological: Does not bruise/bleed easily.  Psychiatric/Behavioral: Negative for agitation and behavioral problems.    Objective:  BP 123/77   Pulse 91   Ht 5' 2"  (1.575 m)   Wt 176 lb 9.6 oz (80.1 kg)   SpO2 100%   BMI 32.30 kg/m   BP/Weight 01/07/2021 11/19/2020 25/0/5397  Systolic BP 673 419 379  Diastolic BP 77 95 81  Wt. (Lbs) 176.6 175 171  BMI 32.3 32.01 31.28      Physical Exam Constitutional:      General: She is not in acute distress.    Appearance: She is well-developed. She is not diaphoretic.  HENT:     Head: Normocephalic.      Right Ear: External ear normal.     Left Ear: External ear normal.     Nose: Nose normal.  Eyes:     Conjunctiva/sclera: Conjunctivae normal.     Pupils: Pupils are equal, round, and reactive to light.  Neck:     Vascular: No JVD.  Cardiovascular:     Rate and Rhythm: Normal rate and regular rhythm.     Heart sounds: Normal heart sounds. No murmur heard. No gallop.   Pulmonary:     Effort: Pulmonary effort is normal. No respiratory distress.     Breath sounds: Normal breath sounds. No wheezing or rales.  Chest:     Chest wall: No tenderness.  Breasts:     Right: No mass or tenderness.     Left: No mass or tenderness.    Abdominal:     General: Bowel sounds are normal. There is no distension.     Palpations: Abdomen is soft. There is no mass.     Tenderness: There is no abdominal tenderness.  Musculoskeletal:        General: No tenderness. Normal range of motion.     Cervical back: Normal range of motion.  Skin:    General: Skin is warm and dry.  Neurological:     Mental Status: She is alert and oriented to person, place, and time.     Deep Tendon Reflexes: Reflexes are normal and symmetric.     CMP Latest Ref Rng & Units 08/05/2020 11/15/2014 04/05/2013  Glucose 70 - 99 mg/dL 95 89 98  BUN 6 - 20 mg/dL 7 6 8   Creatinine 0.44 - 1.00 mg/dL 0.77 0.68 0.82  Sodium 135 - 145 mmol/L 135 140 137  Potassium 3.5 - 5.1 mmol/L 3.4(L) 4.7 4.1  Chloride 98 - 111 mmol/L 102 106 107  CO2 22 - 32 mmol/L 23 22 22   Calcium 8.9 - 10.3 mg/dL 10.5(H) 10.5 10.7(H)  Total Protein 6.5 - 8.1 g/dL 8.9(H) 7.6 -  Total Bilirubin 0.3 - 1.2 mg/dL 0.8 0.5 -  Alkaline Phos 38 - 126 U/L 75 66 -  AST 15 - 41 U/L 74(H) 30 -  ALT 0 - 44 U/L 55(H) 37(H) -    Lipid Panel     Component Value Date/Time   CHOL 181 08/10/2016 0951   TRIG 238 (H) 08/10/2016 0951   HDL 37 (L) 08/10/2016 0951   CHOLHDL 4.9 08/10/2016 0951   VLDL 48 (H) 08/10/2016 0951   LDLCALC 96 08/10/2016 0951    CBC     Component Value Date/Time   WBC 4.2 08/05/2020 1738   RBC 3.54 (L) 08/05/2020 1738   RBC 3.79 (L) 08/05/2020 1738   HGB 7.5 (A) 11/19/2020 1408   HGB 6.7 (LL) 08/05/2020 1738   HCT 24.4 (L) 08/05/2020 1738   PLT 265 08/05/2020 1738   MCV 68.9 (L) 08/05/2020 1738   MCH  18.9 (L) 08/05/2020 1738   MCHC 27.5 (L) 08/05/2020 1738   RDW 19.6 (H) 08/05/2020 1738   LYMPHSABS 2.6 04/05/2013 1805   MONOABS 0.5 04/05/2013 1805   EOSABS 0.1 04/05/2013 1805   BASOSABS 0.0 04/05/2013 1805    Lab Results  Component Value Date   HGBA1C 5.1 11/15/2014    Assessment & Plan:  1. Annual physical exam Counseled on 150 minutes of exercise per week, healthy eating (including decreased daily intake of saturated fats, cholesterol, added sugars, sodium), STI prevention, routine healthcare maintenance. Pap smear at next visit as she is currently on her monthly cycle - HCV RNA quant rflx ultra or genotyp([facility])  2. Encounter for screening mammogram for malignant neoplasm of breast - MM 3D SCREEN BREAST BILATERAL; Future  3. Screening for metabolic disorder - [id number]+LRJP - TSH - T4, free - CBC with Differential/Platelet    No orders of the defined types were placed in this encounter.   Follow-up: Return in about 6 weeks (around 02/18/2021) for PAP smear.       Charlott Rakes, MD, FAAFP. [facility] [place], [place]   01/07/2021, 3:27 PM

## 2021-03-13 ENCOUNTER — Other Ambulatory Visit: Payer: Self-pay

## 2021-03-13 ENCOUNTER — Ambulatory Visit: Payer: Self-pay | Attending: Family Medicine | Admitting: Family Medicine

## 2021-03-13 ENCOUNTER — Encounter: Payer: Self-pay | Admitting: Family Medicine

## 2021-03-13 VITALS — BP 127/78 | HR 80 | Ht 62.0 in | Wt 175.6 lb

## 2021-03-13 DIAGNOSIS — Z13228 Encounter for screening for other metabolic disorders: Secondary | ICD-10-CM

## 2021-03-13 DIAGNOSIS — R102 Pelvic and perineal pain: Secondary | ICD-10-CM

## 2021-03-13 DIAGNOSIS — N926 Irregular menstruation, unspecified: Secondary | ICD-10-CM

## 2021-03-13 DIAGNOSIS — Z124 Encounter for screening for malignant neoplasm of cervix: Secondary | ICD-10-CM

## 2021-03-13 NOTE — Patient Instructions (Signed)
Dismenorrea Dysmenorrhea La dismenorrea son los calambres causados por los espasmos musculares del tero (contracciones) durante el perodo menstrual. La dismenorrea puede ser leve o puede ser tan intensa que interfiere con las actividades cotidianas durante algunos das, todos los [place]. La dismenorrea primaria son los calambres menstruales que duran algunos das cuando una mujer comienza a tener perodos menstruales o poco despus. A medida que la mujer [employer], o tiene un beb, los calambres, generalmente, disminuyen o desaparecen. La dismenorrea secundaria aparece con la edad y es causada por un trastorno en el sistema reproductivo. Dura ms y puede generar ms dolor que la dismenorrea primaria. El dolor puede comenzar antes del perodo y [place] despus. Cules son las causas? La causa de la dismenorrea, generalmente, es un problema preexistente, por ejemplo:  Endometriosis. El tejido que recubre interiormente al tero (endometrio) crece fuera del tero en otras reas del cuerpo.  Adenomiosis. El tejido endometrial crece en las paredes musculares del tero.  Sndrome congestivo plvico. Los vasos sanguneos de la pelvis se llenan de sangre antes del perodo menstrual.  El crecimiento excesivo de las clulas (plipos) en el endometrio o en la parte inferior del tero (cuello uterino).  Prolapso uterino. El tero desciende a la vagina debido a que los msculos estn estirados o dbiles.  Problemas en la vejiga, como infeccin o inflamacin.  Problemas intestinales, como un tumor o sndrome de colon irritable.  Cncer de los rganos genitales o la vejiga. Otras causas de esta afeccin pueden deberse a lo siguiente:  Un tero muy inclinado.  Un cuello uterino cerrado o con una abertura pequea.  Tumores no cancerosos (benignos) en el tero (fibromas).  Enfermedad inflamatoria plvica (EIP).  Cicatrices en la pelvis (adherencias) por cirugas previas.  Un  quiste ovrico.  Un dispositivo intrauterino (DIU). Qu incrementa el riesgo? Es ms probable que sufra esta afeccin si:  Es Garment/textile technologist de [id number].  Inici la pubertad en una etapa temprana.  Tiene sangrado irregular o abundante.  No ha tenido hijos.  Tiene antecedentes familiares de dismenorrea.  Fuma o consume productos con nicotina.  Tiene un peso corporal alto o bajo. Cules son los signos o sntomas? Los sntomas de esta afeccin incluyen:  Marketing executive, dolor punzante en la parte inferior del abdomen o de la espalda, o una sensacin de saciedad en la parte inferior del abdomen.  Los perodos duran ms de 7 das.  Dolores de [place].  Distensin.  Fatiga.  Nuseas o vmitos.  Diarrea o heces lquidas.  Sudoracin o [employer]. Cmo se diagnostica? Esta afeccin se puede diagnosticar en funcin de lo siguiente:  Sus sntomas.  Sus antecedentes mdicos.  Un examen fsico.  Anlisis de sangre.  Prueba de Papanicolaou. Esta es una prueba en la cual las clulas del cuello uterino se examinan en busca de cncer o infeccin.  Una prueba de embarazo. Tambin pueden hacerle otras pruebas, incluidas las siguientes:  Pruebas de diagnstico por imgenes, por ejemplo: ? [place]. ? Un procedimiento para retirar y examinar [place] del tejido endometrial (dilatacin y curetaje, D&C). ? Un procedimiento para examinar visualmente el interior de:  El tero (histeroscopia).  El abdomen o la pelvis (laparoscopia).  La vejiga (cistoscopia). ? Radiografas.  Exploracin por tomografa computarizada (TC).  Resonancia magntica (RM). Cmo se trata? El tratamiento depende de la causa de la dismenorrea. El tratamiento puede incluir medicamentos, por ejemplo:  Analgsicos.  Terapia de reemplazo hormonal. ? Inyecciones de progesterona para detener el perodo menstrual. ? Pldoras  anticonceptivas que contienen la hormona progesterona. ? Un DIU que contiene la  hormona progesterona.  Antiinflamatorios no esteroideos (AINE), como el ibuprofeno. Esto puede ayudar a Ambulance person produccin de hormonas que provoca calambres.  Antidepresivos. Otro tipo tratamiento puede incluir:  Ciruga para eliminar adherencias, endometriosis, quistes ovricos, fibromas o el tero entero (histerectoma).  Ablacin del endometrio. Este es un procedimiento para Geologist, engineering.  Neurectoma presacra. Este es un procedimiento para cortar los nervios en la parte inferior de la columna (sacro) que van hasta los rganos genitales.  Estimulacin del nervio sacro. Este es un procedimiento para aplicar una corriente Art therapist a los nervios en el sacro.  Actividad fsica y fisioterapia.  Meditacin, yoga y acupuntura. Trabaje con su mdico para determinar qu tratamiento o combinacin de tratamientos es mejor para usted. Siga estas instrucciones en su casa: Aliviar el dolor y los calambres  Si se lo indican, aplique calor en la parte inferior de la espalda o el abdomen cuando sienta dolor o calambres. Use la fuente de calor que el mdico le recomiende, como una compresa de calor hmedo o una almohadilla trmica. ? Coloque una [employer] piel y la fuente de Freight forwarder. ? Aplique calor durante 20 a 30minutos. ? Retire la fuente de calor si la piel se pone de color rojo brillante. Esto es especialmente importante si no puede sentir dolor, calor o fro. Puede correr un riesgo mayor de sufrir quemaduras.  No duerma con una almohadilla trmica encendida.  Realizar actividad fsica. Las [employer] caminar, Social worker o andar en bicicleta pueden ayudar a [employer].  [place] la parte inferior de la espalda o el abdomen para Best boy.   Instrucciones generales  Use los medicamentos de venta libre y los recetados solamente como se lo haya indicado el mdico.  Pregntele al mdico si el medicamento recetado le impide conducir o usar  [place].  Evite la ingesta de alcohol y cafena inmediatamente antes y durante el perodo menstrual. Esto puede empeorar los calambres.  No consuma ningn producto que contenga nicotina o tabaco. Estos productos incluyen cigarrillos, tabaco para Higher education careers adviser y aparatos de vapeo, como los Psychologist, sport and exercise. Si necesita ayuda para dejar de fumar, consulte al mdico.  Cumpla con todas las visitas de seguimiento. Esto es importante. Comunquese con un mdico si:  Tiene un dolor que empeora o que no mejora con los medicamentos.  Siente dolor durante el sexo.  Tiene nuseas o vmitos durante el perodo menstrual que no puede controlar con medicamentos. Solicite ayuda de inmediato si:  Se desmaya. Resumen  La dismenorrea son los calambres causados por los espasmos musculares del tero (contracciones) durante el perodo menstrual.  La dismenorrea puede ser leve o puede ser tan intensa que interfiere con las actividades cotidianas durante algunos das, todos los [place].  El tratamiento depende de la causa de la dismenorrea.  Trabaje con su mdico para determinar qu tratamiento o combinacin de tratamientos es mejor para usted. Esta informacin no tiene Marine scientist el consejo del mdico. Asegrese de hacerle al mdico cualquier pregunta que tenga. Document Revised: 07/30/2020 Document Reviewed: 06/24/2020 Elsevier Patient Education  2021 [employer].

## 2021-03-13 NOTE — Progress Notes (Signed)
Subjective:  Patient ID: Katherine [name], female    DOB: 07-19-1981  Age: 40 y.o. MRN: [id number]  CC: Gynecologic Exam   HPI Katherine [name] presents for Pap smear and complaints of pelvic pain.  Interval History: She complains of bilateral pelvic pain when she has her periods or after intercourse. Currently not using any medications for symptoms. Her periods were previously irregular with heavy periods for the initial 2 days which would then normalize.  She saw a Editor, commissioning who prescribed OCPs causing her periods to be more regular occurring every month. Past Medical History:  Diagnosis Date  . Abnormal uterine bleeding   . Anemia   . Anxiety   . Hemorrhoids   . Hyperlipidemia     Past Surgical History:  Procedure Laterality Date  . HEMORRHOID SURGERY  12/12/2010   Dr Greer Pickerel; excision of rt anterior mixed column hemorrhoid, rubber band ligation of Rt lateral int hemorrhoid    Family History  Problem Relation Age of Onset  . Hypertension Mother   . Diabetes Sister     No Known Allergies  Outpatient Medications Prior to Visit  Medication Sig Dispense Refill  . desogestrel-ethinyl estradiol (APRI) 0.15-30 MG-MCG tablet Take 1 tablet by mouth daily. 28 tablet 11  . atorvastatin (LIPITOR) 10 MG tablet Take 1 tablet (10 mg total) by mouth daily. (Patient not taking: No sig reported) 30 tablet 11  . docusate sodium (COLACE) 100 MG capsule Take 1 capsule (100 mg total) by mouth every 12 (twelve) hours. (Patient not taking: No sig reported) 60 capsule 0  . ferrous sulfate 325 (65 FE) MG tablet Take 325 mg by mouth daily with breakfast. (Patient not taking: No sig reported)    . ibuprofen (ADVIL,MOTRIN) 800 MG tablet Take 1 tablet (800 mg total) by mouth every 8 (eight) hours as needed for cramping. (Patient not taking: No sig reported) 60 tablet 2  . medroxyPROGESTERone (DEPO-PROVERA) 150 MG/ML injection Inject 1 mL (150 mg total) into the muscle every 3  (three) months. (Patient not taking: No sig reported) 1 mL 3  . megestrol (MEGACE) 40 MG tablet Take 2 tablets (80 mg total) by mouth 2 (two) times daily. Can increase to 80 mg three times a day in the event of heavy bleeding (Patient not taking: No sig reported) 120 tablet 5  . megestrol (MEGACE) 40 MG tablet 3 tablets a day for 5 days, 2 tablets a day for 5 days then 1 tablet daily (Patient not taking: No sig reported) 45 tablet 11  . predniSONE (DELTASONE) 10 MG tablet Take 4 tablets all at once daily for 10 days (Patient not taking: No sig reported) 40 tablet 0   No facility-administered medications prior to visit.     ROS Review of Systems  Constitutional: Negative for activity change, appetite change and fatigue.  HENT: Negative for congestion, sinus pressure and sore throat.   Eyes: Negative for visual disturbance.  Respiratory: Negative for cough, chest tightness, shortness of breath and wheezing.   Cardiovascular: Negative for chest pain and palpitations.  Gastrointestinal: Negative for abdominal distention, abdominal pain and constipation.       Pelvic pain  Endocrine: Negative for polydipsia.  Genitourinary: Negative for dysuria and frequency.  Musculoskeletal: Negative for arthralgias and back pain.  Skin: Negative for rash.  Neurological: Negative for tremors, light-headedness and numbness.  Hematological: Does not bruise/bleed easily.  Psychiatric/Behavioral: Negative for agitation and behavioral problems.    Objective:  BP 127/78   Pulse 80  Ht 5' 2"  (1.575 m)   Wt 175 lb 9.6 oz (79.7 kg)   SpO2 100%   BMI 32.12 kg/m   BP/Weight 03/13/2021 6/38/4536 01/23/8031  Systolic BP 122 482 500  Diastolic BP 78 77 95  Wt. (Lbs) 175.6 176.6 175  BMI 32.12 32.3 32.01      Physical Exam Constitutional:      Appearance: She is well-developed.  Neck:     Vascular: No JVD.  Cardiovascular:     Rate and Rhythm: Normal rate.     Heart sounds: Normal heart sounds. No  murmur heard.   Pulmonary:     Effort: Pulmonary effort is normal.     Breath sounds: Normal breath sounds. No wheezing or rales.  Chest:     Chest wall: No tenderness.  Abdominal:     General: Bowel sounds are normal. There is no distension.     Palpations: Abdomen is soft. There is no mass.     Tenderness: There is no abdominal tenderness.  Genitourinary:    Comments: External genitalia, vagina, cervix-normal Musculoskeletal:        General: Normal range of motion.     Right lower leg: No edema.     Left lower leg: No edema.  Neurological:     Mental Status: She is alert and oriented to person, place, and time.  Psychiatric:        Mood and Affect: Mood normal.     CMP Latest Ref Rng & Units 08/05/2020 11/15/2014 04/05/2013  Glucose 70 - 99 mg/dL 95 89 98  BUN 6 - 20 mg/dL 7 6 8   Creatinine 0.44 - 1.00 mg/dL 0.77 0.68 0.82  Sodium 135 - 145 mmol/L 135 140 137  Potassium 3.5 - 5.1 mmol/L 3.4(L) 4.7 4.1  Chloride 98 - 111 mmol/L 102 106 107  CO2 22 - 32 mmol/L 23 22 22   Calcium 8.9 - 10.3 mg/dL 10.5(H) 10.5 10.7(H)  Total Protein 6.5 - 8.1 g/dL 8.9(H) 7.6 -  Total Bilirubin 0.3 - 1.2 mg/dL 0.8 0.5 -  Alkaline Phos 38 - 126 U/L 75 66 -  AST 15 - 41 U/L 74(H) 30 -  ALT 0 - 44 U/L 55(H) 37(H) -    Lipid Panel     Component Value Date/Time   CHOL 181 08/10/2016 0951   TRIG 238 (H) 08/10/2016 0951   HDL 37 (L) 08/10/2016 0951   CHOLHDL 4.9 08/10/2016 0951   VLDL 48 (H) 08/10/2016 0951   LDLCALC 96 08/10/2016 0951    CBC    Component Value Date/Time   WBC 4.2 08/05/2020 1738   RBC 3.54 (L) 08/05/2020 1738   RBC 3.79 (L) 08/05/2020 1738   HGB 7.5 (A) 11/19/2020 1408   HGB 6.7 (LL) 08/05/2020 1738   HCT 24.4 (L) 08/05/2020 1738   PLT 265 08/05/2020 1738   MCV 68.9 (L) 08/05/2020 1738   MCH 18.9 (L) 08/05/2020 1738   MCHC 27.5 (L) 08/05/2020 1738   RDW 19.6 (H) 08/05/2020 1738   LYMPHSABS 2.6 04/05/2013 1805   MONOABS 0.5 04/05/2013 1805   EOSABS 0.1  04/05/2013 1805   BASOSABS 0.0 04/05/2013 1805    Lab Results  Component Value Date   HGBA1C 5.1 11/15/2014    Assessment & Plan:  1. Pelvic pain With associated dysmenorrhea Improved on OCP Advised to use NSAIDs - CBC with Differential/Platelet  2. Screening for cervical cancer - Cytology - PAP()  3. Irregular menses Symptoms are controlled on OCP Follow-up  with GYN  4. Screening for metabolic disorder - CUN82+GYYO - Lipid panel - Hemoglobin A1c    No orders of the defined types were placed in this encounter.   Follow-up: Return if symptoms worsen or fail to improve.       Charlott Rakes, MD, FAAFP. [facility] and [facility] [place], [place]   03/13/2021, 1:42 PM

## 2021-03-13 NOTE — Progress Notes (Signed)
Having pelvic pain. Wants to get lab work today.

## 2021-03-14 LAB — CBC WITH DIFFERENTIAL/PLATELET
Basophils Absolute: 0 10*3/uL (ref 0.0–0.2)
Basos: 1 %
EOS (ABSOLUTE): 0.1 10*3/uL (ref 0.0–0.4)
Eos: 1 %
Hematocrit: 29.6 % — ABNORMAL LOW (ref 34.0–46.6)
Hemoglobin: 9 g/dL — ABNORMAL LOW (ref 11.1–15.9)
Immature Grans (Abs): 0 10*3/uL (ref 0.0–0.1)
Immature Granulocytes: 0 %
Lymphocytes Absolute: 1.8 10*3/uL (ref 0.7–3.1)
Lymphs: 27 %
MCH: 23.7 pg — ABNORMAL LOW (ref 26.6–33.0)
MCHC: 30.4 g/dL — ABNORMAL LOW (ref 31.5–35.7)
MCV: 78 fL — ABNORMAL LOW (ref 79–97)
Monocytes Absolute: 0.4 10*3/uL (ref 0.1–0.9)
Monocytes: 7 %
Neutrophils Absolute: 4.3 10*3/uL (ref 1.4–7.0)
Neutrophils: 64 %
Platelets: 373 10*3/uL (ref 150–450)
RBC: 3.8 x10E6/uL (ref 3.77–5.28)
RDW: 15.4 % (ref 11.7–15.4)
WBC: 6.7 10*3/uL (ref 3.4–10.8)

## 2021-03-14 LAB — CMP14+EGFR
ALT: 28 IU/L (ref 0–32)
AST: 27 IU/L (ref 0–40)
Albumin/Globulin Ratio: 1.3 (ref 1.2–2.2)
Albumin: 4.1 g/dL (ref 3.8–4.8)
Alkaline Phosphatase: 63 IU/L (ref 44–121)
BUN/Creatinine Ratio: 8 — ABNORMAL LOW (ref 9–23)
BUN: 7 mg/dL (ref 6–24)
Bilirubin Total: 0.3 mg/dL (ref 0.0–1.2)
CO2: 20 mmol/L (ref 20–29)
Calcium: 10.6 mg/dL — ABNORMAL HIGH (ref 8.7–10.2)
Chloride: 104 mmol/L (ref 96–106)
Creatinine, Ser: 0.83 mg/dL (ref 0.57–1.00)
Globulin, Total: 3.2 g/dL (ref 1.5–4.5)
Glucose: 82 mg/dL (ref 65–99)
Potassium: 4.5 mmol/L (ref 3.5–5.2)
Sodium: 135 mmol/L (ref 134–144)
Total Protein: 7.3 g/dL (ref 6.0–8.5)
eGFR: 90 mL/min/{1.73_m2} (ref >59)

## 2021-03-14 LAB — LIPID PANEL
Chol/HDL Ratio: 3.8 ratio (ref 0.0–4.4)
Cholesterol, Total: 181 mg/dL (ref 100–199)
HDL: 48 mg/dL (ref >39)
LDL Chol Calc (NIH): 110 mg/dL — ABNORMAL HIGH (ref 0–99)
Triglycerides: 132 mg/dL (ref 0–149)
VLDL Cholesterol Cal: 23 mg/dL (ref 5–40)

## 2021-03-14 LAB — CYTOLOGY - PAP
Comment: NEGATIVE
Diagnosis: NEGATIVE
High risk HPV: NEGATIVE

## 2021-03-14 LAB — HEMOGLOBIN A1C
Est. average glucose Bld gHb Est-mCnc: 114 mg/dL
Hgb A1c MFr Bld: 5.6 % (ref 4.8–5.6)

## 2021-03-31 ENCOUNTER — Telehealth: Payer: Self-pay

## 2021-03-31 NOTE — Telephone Encounter (Signed)
-----   Message from Charlott Rakes, MD sent at 03/19/2021  1:27 PM EDT ----- Please inform her that her cholesterol is normal, kidney and liver functions are normal.  Calcium is elevated similar to results she has had in the past.  Please advised to avoid OTC calcium supplements.  She is anemic but this has improved compared to previous labs.  Advised to continue with iron tablets and follow-up with GYN regarding abnormal uterine bleed.  Pap smear is normal.  She tested negative for diabetes.

## 2021-03-31 NOTE — Telephone Encounter (Signed)
Patient name and DOB has been verified Patient was informed of lab results. Patient had no questions.  

## 2021-05-16 ENCOUNTER — Telehealth (INDEPENDENT_AMBULATORY_CARE_PROVIDER_SITE_OTHER): Payer: Self-pay

## 2021-05-16 ENCOUNTER — Other Ambulatory Visit: Payer: Self-pay

## 2021-05-16 DIAGNOSIS — Z1231 Encounter for screening mammogram for malignant neoplasm of breast: Secondary | ICD-10-CM

## 2021-05-16 NOTE — Telephone Encounter (Signed)
Order has been changed and patient has been given information on how to reschedule appointment.

## 2021-05-16 NOTE — Progress Notes (Unsigned)
Order was changed due to patient stating that she had a bump on her right breast.

## 2021-05-16 NOTE — Telephone Encounter (Signed)
Copied from [place] [id number]. Topic: General - Other >> May 15, 2021  8:45 AM Loma Boston wrote: Pt had an order by Dr Margarita Rana re mammogram. Pt went to get mammogram and was tod due to concern that must be a diagnostic mammogram, pt then went to mobile unit and would not do for same reason, Please contact pt with interpreter-Spanish and advise At [phone]

## 2021-07-07 ENCOUNTER — Other Ambulatory Visit: Payer: Self-pay

## 2021-07-07 DIAGNOSIS — N6310 Unspecified lump in the right breast, unspecified quadrant: Secondary | ICD-10-CM

## 2021-07-24 ENCOUNTER — Ambulatory Visit
Admission: RE | Admit: 2021-07-24 | Discharge: 2021-07-24 | Disposition: A | Discharge status: Home / Self Care | Payer: Self-pay | Source: Ambulatory Visit | Attending: Obstetrics and Gynecology | Admitting: Obstetrics and Gynecology

## 2021-07-24 ENCOUNTER — Ambulatory Visit
Admission: RE | Admit: 2021-07-24 | Discharge: 2021-07-24 | Disposition: A | Discharge status: Home / Self Care | Payer: No Typology Code available for payment source | Source: Ambulatory Visit | Attending: Obstetrics and Gynecology | Admitting: Obstetrics and Gynecology

## 2021-07-24 ENCOUNTER — Ambulatory Visit: Payer: Self-pay | Admitting: *Deleted

## 2021-07-24 ENCOUNTER — Other Ambulatory Visit: Payer: Self-pay

## 2021-07-24 VITALS — BP 142/98 | Wt 180.1 lb

## 2021-07-24 DIAGNOSIS — Z1239 Encounter for other screening for malignant neoplasm of breast: Secondary | ICD-10-CM

## 2021-07-24 DIAGNOSIS — N6310 Unspecified lump in the right breast, unspecified quadrant: Secondary | ICD-10-CM

## 2021-07-24 DIAGNOSIS — N6315 Unspecified lump in the right breast, overlapping quadrants: Secondary | ICD-10-CM

## 2021-07-24 NOTE — Patient Instructions (Signed)
Explained breast self awareness with [name]. Patient did not need a Pap smear today due to last Pap smear and HPV typing was 03/13/2021. Let her know BCCCP will cover Pap smears and HPV typing every 5 years unless has a history of abnormal Pap smears. Referred patient to the [facility] for a diagnostic mammogram. Appointment scheduled Thursday, July 24, 2021 at 1240. Patient aware of appointment and will be there. Katherine [name] verbalized understanding.  [name], [name], RN 2:36 PM

## 2021-07-24 NOTE — Progress Notes (Signed)
Ms. Katherine [name] is a 40 y.o. female who presents to [facility] today with complaint of right breast lump x 3 months that is painful. Patient states the pain comes and goes. Patient rates the pain at a 3 out of 10.    Pap Smear: Pap smear not completed today. Last Pap smear was 03/13/2021 at [facility] and was normal with negative HPV. Per patient has no history of an abnormal Pap smear. Last Pap smear result is available in Epic.   Physical exam: Breasts Breasts symmetrical. No skin abnormalities bilateral breasts. No nipple retraction bilateral breasts. No nipple discharge bilateral breasts. No lymphadenopathy. No lumps palpated left breast. Palpated a pea sized lump within the right breast at 12 o'clock 9 cm from the nipple. Complaints of right breast pain when palpated right breast lump.  MM DIAG BREAST TOMO BILATERAL  Result Date: 10/01/2016 CLINICAL DATA:  40 year old presenting with intermittent focal pain in the outer left breast over the past 6 months. Annual evaluation, right breast. EXAM: 2D DIGITAL DIAGNOSTIC BILATERAL MAMMOGRAM WITH CAD AND ADJUNCT TOMO ULTRASOUND LEFT BREAST COMPARISON:  Mammography 12/20/2014, 06/13/2009. Left breast ultrasound 12/20/2014 was performed in a different part of the breast. ACR Breast Density Category c: The breast tissue is heterogeneously dense, which may obscure small masses. FINDINGS: Standard 2D and tomosynthesis full field CC and MLO views of both breasts were obtained. A standard and tomosynthesis spot tangential view of the area of focal pain in the left breast was also obtained. No findings suspicious for malignancy in either breast. Specifically, no mammographic abnormality in the area of focal pain in the outer left breast. Mammographic images were processed with CAD. On physical exam, there is no palpable abnormality in outer left breast. The patient does describe tenderness to palpation. Targeted  left breast ultrasound is performed, showing normal fibrofatty and fibroglandular tissue in the outer left breast between 2 o'clock and 3 o'clock in the area of focal pain. No cyst, solid mass or abnormal acoustic shadowing is identified. IMPRESSION: 1. No mammographic or sonographic evidence of malignancy, left breast. 2. No mammographic evidence of malignancy, right breast. RECOMMENDATION: Screening mammogram at age 89 unless there are persistent or intervening clinical concerns. (Code:SM-B-40A) I have discussed the findings and recommendations with the patient. Results were also provided in writing at the conclusion of the visit. If applicable, a reminder letter will be sent to the patient regarding the next appointment. BI-RADS CATEGORY  1: Negative. Electronically Signed   By: Evangeline Dakin M.D.   On: 10/01/2016 14:05       Pelvic/Bimanual Pap is not indicated today per BCCCP guidelines.   Smoking History: Patient has never smoked.   Patient Navigation: Patient education provided. Access to services provided for patient through [place] program. Spanish interpreter Rudene Anda from [facility] provided.    Breast and Cervical Cancer Risk Assessment: Patient does not have family history of breast cancer, known genetic mutations, or radiation treatment to the chest before age 19. Patient does not have history of cervical dysplasia, immunocompromised, or DES exposure in-utero.  Risk Assessment     Risk Scores       07/24/2021   Last edited by: Royston Bake, CMA   5-year risk: 0.3 %   Lifetime risk: 5.1 %            A: BCCCP exam without pap smear Complaint of right breast lump and pain.  P: Referred patient to [facility] for a diagnostic mammogram. Appointment scheduled Thursday, July 24, 2021 at 1240.  Loletta Parish, RN 07/24/2021 2:36 PM

## 2021-08-21 ENCOUNTER — Ambulatory Visit (INDEPENDENT_AMBULATORY_CARE_PROVIDER_SITE_OTHER): Payer: Self-pay | Admitting: Obstetrics & Gynecology

## 2021-08-21 ENCOUNTER — Encounter: Payer: Self-pay | Admitting: Obstetrics & Gynecology

## 2021-08-21 ENCOUNTER — Other Ambulatory Visit: Payer: Self-pay

## 2021-08-21 VITALS — BP 126/84 | HR 95 | Ht 62.0 in | Wt 180.5 lb

## 2021-08-21 DIAGNOSIS — D509 Iron deficiency anemia, unspecified: Secondary | ICD-10-CM

## 2021-08-21 DIAGNOSIS — D649 Anemia, unspecified: Secondary | ICD-10-CM

## 2021-08-21 DIAGNOSIS — N921 Excessive and frequent menstruation with irregular cycle: Secondary | ICD-10-CM

## 2021-08-21 LAB — POCT HEMOGLOBIN: Hemoglobin: 7.5 g/dL — AB (ref 11–14.6)

## 2021-08-21 MED ORDER — DESOGESTREL-ETHINYL ESTRADIOL 0.15-30 MG-MCG PO TABS
1.0000 | ORAL_TABLET | Freq: Every day | ORAL | 11 refills | Status: DC
Start: 2021-08-21 — End: 2021-11-04

## 2021-08-21 NOTE — Progress Notes (Signed)
Chief Complaint  Patient presents with   Follow-up    Itching all over and feels nervous; having mood changes; feels tired and has headaches; + nausea; swelling all over body      40 y.o. [id number] Patient's last menstrual period was 08/14/2021. The current method of family planning is OCP (estrogen/progesterone).  Outpatient Encounter Medications as of 08/21/2021  Medication Sig   docusate sodium (COLACE) 100 MG capsule Take 1 capsule (100 mg total) by mouth every 12 (twelve) hours.   UNABLE TO FIND Birth control pill-daily; unsure of name   desogestrel-ethinyl estradiol (APRI) 0.15-30 MG-MCG tablet Take 1 tablet by mouth daily.   [DISCONTINUED] atorvastatin (LIPITOR) 10 MG tablet Take 1 tablet (10 mg total) by mouth daily. (Patient not taking: No sig reported)   [DISCONTINUED] desogestrel-ethinyl estradiol (APRI) 0.15-30 MG-MCG tablet Take 1 tablet by mouth daily. (Patient not taking: Reported on 08/21/2021)   [DISCONTINUED] ferrous sulfate 325 (65 FE) MG tablet Take 325 mg by mouth daily with breakfast. (Patient not taking: No sig reported)   [DISCONTINUED] ibuprofen (ADVIL,MOTRIN) 800 MG tablet Take 1 tablet (800 mg total) by mouth every 8 (eight) hours as needed for cramping. (Patient not taking: No sig reported)   [DISCONTINUED] medroxyPROGESTERone (DEPO-PROVERA) 150 MG/ML injection Inject 1 mL (150 mg total) into the muscle every 3 (three) months. (Patient not taking: No sig reported)   [DISCONTINUED] megestrol (MEGACE) 40 MG tablet Take 2 tablets (80 mg total) by mouth 2 (two) times daily. Can increase to 80 mg three times a day in the event of heavy bleeding (Patient not taking: No sig reported)   [DISCONTINUED] megestrol (MEGACE) 40 MG tablet 3 tablets a day for 5 days, 2 tablets a day for 5 days then 1 tablet daily (Patient not taking: No sig reported)   [DISCONTINUED] predniSONE (DELTASONE) 10 MG tablet Take 4 tablets all at once daily for 10 days (Patient not taking: No sig  reported)   No facility-administered encounter medications on file as of 08/21/2021.    Subjective Long history of menometrorrhagia and anemia Have not been able to manage her cycles with either megestrol depo provera or COC Hemoglobin today 7.4 Her periods are better but still has clots Past Medical History:  Diagnosis Date   Abnormal uterine bleeding    Anemia    Anxiety    Hemorrhoids    Hyperlipidemia     Past Surgical History:  Procedure Laterality Date   HEMORRHOID SURGERY  12/12/2010   Dr Greer Pickerel; excision of rt anterior mixed column hemorrhoid, rubber band ligation of Rt lateral int hemorrhoid    OB History     Gravida  5   Para  4   Term  3   Preterm  1   AB  1   Living  3      SAB  1   IAB      Ectopic      Multiple      Live Births              No Known Allergies  Social History   Socioeconomic History   Marital status: Married    Spouse name: Not on file   Number of children: Not on file   Years of education: Not on file   Highest education level: Not on file  Occupational History   Not on file  Tobacco Use   Smoking status: Never   Smokeless tobacco: Never  Vaping Use  Vaping Use: Never used  Substance and Sexual Activity   Alcohol use: No   Drug use: No   Sexual activity: Yes    Birth control/protection: Pill  Other Topics Concern   Not on file  Social History Narrative   Not on file   Social Determinants of Health   Financial Resource Strain: Not on file  Food Insecurity: No Food Insecurity   Worried About Running Out of Food in the Last Year: Never true   Ran Out of Food in the Last Year: Never true  Transportation Needs: No Transportation Needs   Lack of Transportation (Medical): No   Lack of Transportation (Non-Medical): No  Physical Activity: Not on file  Stress: Not on file  Social Connections: Not on file    Family History  Problem Relation Age of Onset   Diabetes Father    Hypertension Mother     Diabetes Sister     Medications:       Current Outpatient Medications:    docusate sodium (COLACE) 100 MG capsule, Take 1 capsule (100 mg total) by mouth every 12 (twelve) hours., Disp: 60 capsule, Rfl: 0   UNABLE TO FIND, Birth control pill-daily; unsure of name, Disp: , Rfl:    desogestrel-ethinyl estradiol (APRI) 0.15-30 MG-MCG tablet, Take 1 tablet by mouth daily., Disp: 28 tablet, Rfl: 11  Objective Blood pressure 126/84, pulse 95, height 5\' 2"  (1.575 m), weight 180 lb 8 oz (81.9 kg), last menstrual period 08/14/2021.  Gen WDWN NAD  Pertinent ROS No burning with urination, frequency or urgency No nausea, vomiting or diarrhea Nor fever chills or other constitutional symptoms   Labs or studies Hemoglobin 7.4    Impression Diagnoses this Encounter::   ICD-10-CM   1. Menometrorrhagia  N92.1     2. Anemia, unspecified type  D64.9 POCT hemoglobin    3. Iron deficiency anemia, unspecified iron deficiency anemia type  D50.9       Established relevant diagnosis(es):   Plan/Recommendations: Meds ordered this encounter  Medications   desogestrel-ethinyl estradiol (APRI) 0.15-30 MG-MCG tablet    Sig: Take 1 tablet by mouth daily.    Dispense:  28 tablet    Refill:  11     Labs or Scans Ordered: Orders Placed This Encounter  Procedures   POCT hemoglobin    Management:: I Have recommend abdominal supracervical hysterectomy vs vaginal(no exam today)  Pt is going to consider continue the [place] in the interim Encouraged to take her iron tablets, avoids due to hemorrhoids  Follow up Return if symptoms worsen or fail to improve.       All questions were answered.

## 2021-10-07 ENCOUNTER — Encounter: Payer: Self-pay | Admitting: Obstetrics & Gynecology

## 2021-10-07 ENCOUNTER — Ambulatory Visit (INDEPENDENT_AMBULATORY_CARE_PROVIDER_SITE_OTHER): Payer: Self-pay | Admitting: Obstetrics & Gynecology

## 2021-10-07 ENCOUNTER — Other Ambulatory Visit: Payer: Self-pay

## 2021-10-07 VITALS — BP 131/83 | HR 92 | Ht 62.0 in | Wt 187.0 lb

## 2021-10-07 DIAGNOSIS — D649 Anemia, unspecified: Secondary | ICD-10-CM

## 2021-10-07 DIAGNOSIS — N921 Excessive and frequent menstruation with irregular cycle: Secondary | ICD-10-CM

## 2021-10-07 DIAGNOSIS — D509 Iron deficiency anemia, unspecified: Secondary | ICD-10-CM

## 2021-10-07 DIAGNOSIS — N946 Dysmenorrhea, unspecified: Secondary | ICD-10-CM

## 2021-10-07 LAB — POCT HEMOGLOBIN: Hemoglobin: 7 g/dL — AB (ref 11–14.6)

## 2021-10-07 NOTE — Progress Notes (Signed)
Chief Complaint  Patient presents with   Pre-op Exam      40 y.o. [id number] No LMP recorded. (Menstrual status: Oral contraceptives). The current method of family planning is OCP (estrogen/progesterone).  Outpatient Encounter Medications as of 10/07/2021  Medication Sig   desogestrel-ethinyl estradiol (APRI) 0.15-30 MG-MCG tablet Take 1 tablet by mouth daily.   docusate sodium (COLACE) 100 MG capsule Take 1 capsule (100 mg total) by mouth every 12 (twelve) hours. (Patient not taking: Reported on 10/07/2021)   UNABLE TO FIND Birth control pill-daily; unsure of name (Patient not taking: Reported on 10/07/2021)   No facility-administered encounter medications on file as of 10/07/2021.    Subjective Pt with regulated menses still heavy for 2 days Then normal for a few days Pain associated with ehr periods Past Medical History:  Diagnosis Date   Abnormal uterine bleeding    Anemia    Anxiety    Hemorrhoids    Hyperlipidemia     Past Surgical History:  Procedure Laterality Date   HEMORRHOID SURGERY  12/12/2010   Dr Greer Pickerel; excision of rt anterior mixed column hemorrhoid, rubber band ligation of Rt lateral int hemorrhoid    OB History     Gravida  5   Para  4   Term  3   Preterm  1   AB  1   Living  3      SAB  1   IAB      Ectopic      Multiple      Live Births              No Known Allergies  Social History   Socioeconomic History   Marital status: Married    Spouse name: Not on file   Number of children: Not on file   Years of education: Not on file   Highest education level: Not on file  Occupational History   Not on file  Tobacco Use   Smoking status: Never   Smokeless tobacco: Never  Vaping Use   Vaping Use: Never used  Substance and Sexual Activity   Alcohol use: No   Drug use: No   Sexual activity: Yes    Birth control/protection: Pill  Other Topics Concern   Not on file  Social History Narrative   Not on file    Social Determinants of Health   Financial Resource Strain: Not on file  Food Insecurity: No Food Insecurity   Worried About Running Out of Food in the Last Year: Never true   Ran Out of Food in the Last Year: Never true  Transportation Needs: No Transportation Needs   Lack of Transportation (Medical): No   Lack of Transportation (Non-Medical): No  Physical Activity: Not on file  Stress: Not on file  Social Connections: Not on file    Family History  Problem Relation Age of Onset   Diabetes Father    Hypertension Mother    Diabetes Sister     Medications:       Current Outpatient Medications:    desogestrel-ethinyl estradiol (APRI) 0.15-30 MG-MCG tablet, Take 1 tablet by mouth daily., Disp: 28 tablet, Rfl: 11   docusate sodium (COLACE) 100 MG capsule, Take 1 capsule (100 mg total) by mouth every 12 (twelve) hours. (Patient not taking: Reported on 10/07/2021), Disp: 60 capsule, Rfl: 0   UNABLE TO FIND, Birth control pill-daily; unsure of name (Patient not taking: Reported on 10/07/2021), Disp: , Rfl:  Objective Blood pressure 131/83, pulse 92, height 5\' 2"  (1.575 m), weight 187 lb (84.8 kg).  General WDWN female NAD Vulva:  normal appearing vulva with no masses, tenderness or lesions Vagina:  normal mucosa, no discharge Cervix:  Normal no lesions Uterus:  normal size, contour, position, consistency, mobility, non-tender Adnexa: ovaries:present,  normal adnexa in size, nontender and no masses   Pertinent ROS No burning with urination, frequency or urgency No nausea, vomiting or diarrhea Nor fever chills or other constitutional symptoms   Labs or studies Hemoglobin 7.0    Impression Diagnoses this Encounter::   ICD-10-CM   1. Menometrorrhagia  N92.1     2. Anemia, unspecified type  D64.9 POCT hemoglobin    3. Iron deficiency anemia, unspecified iron deficiency anemia type  D50.9     4. Dysmenorrhea  N94.6       Established relevant  diagnosis(es):   Plan/Recommendations: No orders of the defined types were placed in this encounter.   Labs or Scans Ordered: Orders Placed This Encounter  Procedures   POCT hemoglobin    Management:: Iron qod Plan TVH 11/19/21  Follow up Return in about 8 weeks (around 12/01/2021) for with Dr Elonda Husky, Post Op.   =   All questions were answered.

## 2021-11-04 ENCOUNTER — Other Ambulatory Visit: Payer: Self-pay | Admitting: Obstetrics & Gynecology

## 2021-11-17 ENCOUNTER — Other Ambulatory Visit (HOSPITAL_COMMUNITY)
Admission: RE | Admit: 2021-11-17 | Discharge: 2021-11-17 | Disposition: A | Discharge status: Home / Self Care | Payer: Self-pay | Source: Ambulatory Visit | Attending: Obstetrics & Gynecology | Admitting: Obstetrics & Gynecology

## 2021-11-17 ENCOUNTER — Other Ambulatory Visit: Payer: Self-pay

## 2021-11-17 ENCOUNTER — Other Ambulatory Visit: Payer: Self-pay | Admitting: Obstetrics & Gynecology

## 2021-11-17 ENCOUNTER — Encounter (HOSPITAL_COMMUNITY): Payer: Self-pay

## 2021-11-17 ENCOUNTER — Encounter (HOSPITAL_COMMUNITY)
Admission: RE | Admit: 2021-11-17 | Discharge: 2021-11-17 | Disposition: A | Discharge status: Home / Self Care | Payer: Self-pay | Source: Ambulatory Visit | Attending: Obstetrics & Gynecology | Admitting: Obstetrics & Gynecology

## 2021-11-17 VITALS — BP 127/66 | HR 91 | Temp 98.5°F | Resp 18 | Ht 62.0 in | Wt 180.0 lb

## 2021-11-17 DIAGNOSIS — Z01818 Encounter for other preprocedural examination: Secondary | ICD-10-CM

## 2021-11-17 DIAGNOSIS — Z01812 Encounter for preprocedural laboratory examination: Secondary | ICD-10-CM | POA: Insufficient documentation

## 2021-11-17 DIAGNOSIS — Z20822 Contact with and (suspected) exposure to covid-19: Secondary | ICD-10-CM | POA: Insufficient documentation

## 2021-11-17 DIAGNOSIS — N939 Abnormal uterine and vaginal bleeding, unspecified: Secondary | ICD-10-CM | POA: Insufficient documentation

## 2021-11-17 LAB — CBC
HCT: 25.7 % — ABNORMAL LOW (ref 36.0–46.0)
Hemoglobin: 6.8 g/dL — CL (ref 12.0–15.0)
MCH: 19 pg — ABNORMAL LOW (ref 26.0–34.0)
MCHC: 26.5 g/dL — ABNORMAL LOW (ref 30.0–36.0)
MCV: 72 fL — ABNORMAL LOW (ref 80.0–100.0)
Platelets: 316 10*3/uL (ref 150–400)
RBC: 3.57 MIL/uL — ABNORMAL LOW (ref 3.87–5.11)
RDW: 17.3 % — ABNORMAL HIGH (ref 11.5–15.5)
WBC: 8.6 10*3/uL (ref 4.0–10.5)
nRBC: 0 % (ref 0.0–0.2)

## 2021-11-17 LAB — RAPID HIV SCREEN (HIV 1/2 AB+AG)
HIV 1/2 Antibodies: NONREACTIVE
HIV-1 P24 Antigen - HIV24: NONREACTIVE

## 2021-11-17 LAB — COMPREHENSIVE METABOLIC PANEL
ALT: 112 U/L — ABNORMAL HIGH (ref 0–44)
AST: 154 U/L — ABNORMAL HIGH (ref 15–41)
Albumin: 3.4 g/dL — ABNORMAL LOW (ref 3.5–5.0)
Alkaline Phosphatase: 82 U/L (ref 38–126)
Anion gap: 4 — ABNORMAL LOW (ref 5–15)
BUN: 10 mg/dL (ref 6–20)
CO2: 21 mmol/L — ABNORMAL LOW (ref 22–32)
Calcium: 10.3 mg/dL (ref 8.9–10.3)
Chloride: 108 mmol/L (ref 98–111)
Creatinine, Ser: 0.7 mg/dL (ref 0.44–1.00)
GFR, Estimated: >60 mL/min (ref >60)
Glucose, Bld: 116 mg/dL — ABNORMAL HIGH (ref 70–99)
Potassium: 3.5 mmol/L (ref 3.5–5.1)
Sodium: 133 mmol/L — ABNORMAL LOW (ref 135–145)
Total Bilirubin: 0.5 mg/dL (ref 0.3–1.2)
Total Protein: 7.2 g/dL (ref 6.5–8.1)

## 2021-11-17 LAB — PREPARE RBC (CROSSMATCH)

## 2021-11-17 LAB — URINALYSIS, MICROSCOPIC (REFLEX)
RBC / HPF: NONE SEEN RBC/hpf (ref 0–5)
Squamous Epithelial / HPF: >50 (ref 0–5)

## 2021-11-17 LAB — URINALYSIS, ROUTINE W REFLEX MICROSCOPIC
Bilirubin Urine: NEGATIVE
Glucose, UA: NEGATIVE mg/dL
Hgb urine dipstick: NEGATIVE
Ketones, ur: NEGATIVE mg/dL
Leukocytes,Ua: NEGATIVE
Nitrite: NEGATIVE
Protein, ur: 100 mg/dL — AB
Specific Gravity, Urine: >1.03 — ABNORMAL HIGH (ref 1.005–1.030)
pH: 5.5 (ref 5.0–8.0)

## 2021-11-17 LAB — HCG, QUANTITATIVE, PREGNANCY: hCG, Beta Chain, Quant, S: <1 m[IU]/mL (ref <5)

## 2021-11-17 NOTE — Patient Instructions (Addendum)
Instrucciones Para Antes de la Ciruga   Su ciruga est programada para-(your procedure is scheduled on) 11/19/2021 at [facility] - (enter)    Por favor llame al [phone] si tiene algn problema en la maana de la ciruga. (please call if you have any problems the morning of surgery.)                  Recuerde: (Remember)    (Do not eat food after midnight 11/18/2021. Yo may drink liquids until 0330 AM.    At 0330 AM, drink your carb drink. You may have nothing else to drink after you drink this.      Puede cepillarse los dientes en la maana de la [place]. (you may brush your teeth the morning of surgery)   No use joyas, maquillaje de ojos, lpiz labial, crema para el cuerpo o esmalte de uas oscuro. (Do not wear jewelry, eye makeup, lipstick, body lotion, or dark fingernail polish)   No puede usar desodorante. (you may wear deodorant)   Si va a ser ingresado despues de la ciruga, deje la maleta en el carro hasta que se le haya asignado una habitacin. (If you are to be admitted after surgery, leave suitcase in car until your room has been assigned.)   A los pacientes que se les d de alta el mismo da no se les permitir manejar a casa.  (Patients discharged on the day of surgery will not be allowed to drive home)   Use ropa suelta y cmoda de regreso a casa. (wear loose comfortable clothes for ride home)         Histerectoma vaginal, cuidados posteriores Vaginal Hysterectomy, Care After La siguiente informacin ofrece orientacin sobre cmo cuidarse despus del procedimiento. El mdico tambin podr darle instrucciones ms especficas. Comunquese con el mdico si tiene problemas o preguntas. Qu puedo esperar despus del procedimiento? Despus del procedimiento, es normal tener los siguientes sntomas: Dolor en la parte baja del abdomen y la vagina. Hemorragia y secrecin vaginal por hasta 1 semana.  Deber usar una compresa higinica luego de este procedimiento. Dificultad para defecar (estreimiento). Problemas transitorios para Garment/textile technologist. Cansancio (fatiga). Falta de apetito. Menos inters por el sexo. Sensacin de tristeza u otras emociones. Si se extirparon los ovarios tambin, es comn tener sntomas de menopausia como acaloramiento, sudor nocturno y falta de sueo (insomnio). Siga estas instrucciones en su casa: Medicamentos  Use los medicamentos de venta libre y los recetados solamente como se lo haya indicado el mdico. No tome aspirina ni antiinflamatorios no esteroideos (AINE), como el ibuprofeno. Estos medicamentos pueden provocarle sangrado. Pregntele al mdico si el medicamento recetado: Hace necesario que evite conducir o usar [place]. Puede causarle estreimiento. Es posible que tenga que tomar estas medidas para prevenir o tratar el estreimiento: Electronics engineer suficiente lquido como para Theatre manager la orina de color amarillo plido. Usar medicamentos recetados o de Radio broadcast assistant. Consumir alimentos ricos en fibra, como frijoles, cereales integrales, y frutas y verduras frescas. Limitar el consumo de alimentos ricos en grasa y azcares procesados, como los alimentos fritos o dulces. Actividad  Haga reposo como se lo haya indicado el mdico. Retome sus actividades normales segn lo indicado por el mdico.  Pregntele al mdico qu actividades son seguras para usted. Evite estar sentada durante largos perodos sin moverse. Levntese y camine un poco cada 1 a 2 horas. Esto es importante para mejorar el flujo sanguneo y la respiracin. Pida ayuda si se siente dbil o inestable. Trate de que alguien la acompae en su casa durante 1 o 2 semanas para ayudarla con los [employer]. No levante ningn objeto que pese ms de 10 libras (4.5 kg) o que supere el lmite de peso que le hayan indicado, Nurse, children's que el mdico le diga que puede [place]. Si le administraron un sedante durante  el procedimiento, puede afectarla por varias horas. No conduzca ni opere maquinaria hasta que el mdico le indique que es seguro [place]. Estilo de vida No consuma ningn producto que contenga nicotina o tabaco. Estos productos incluyen cigarrillos, tabaco para Higher education careers adviser y aparatos de vapeo, como los Psychologist, sport and exercise. Estos pueden retrasar la cicatrizacin despus de la ciruga. Si necesita ayuda para dejar de consumir estos productos, consulte al mdico. No beba alcohol hasta que el mdico lo autorice. Indicaciones generales No se haga duchas vaginales, no utilice tampones ni tenga relaciones sexuales durante al menos 6 semanas o como se lo haya indicado el mdico. Si experimenta cambios fsicos o emocionales despus del procedimiento, hable con su mdico o un terapeuta. Los puntos que le hicieron dentro de la vagina se disolvern con el [place], por lo que no ser necesario retirrselos. No tome baos de inmersin, no nade ni use el jacuzzi hasta que el mdico lo autorice. Es posible que solo pueda tomar duchas durante 2 a 3 semanas. Use medias de compresin como se lo haya indicado su mdico. Estas medias ayudan a evitar la formacin de cogulos de [place] y a reducir la hinchazn de las piernas. Cumpla con todas las visitas de seguimiento. Esto es importante. Comunquese con un mdico si: El medicamento no Production designer, theatre/television/film. Tiene fiebre. Tiene nuseas o vmitos que no desaparecen. Siente mareos. Tiene secrecin de sangre, pus o flujo con mal olor que provienen de la vagina ms de 1 semana despus del procedimiento. Sigue teniendo problemas para Garment/textile technologist de 3 a 5 das despus del procedimiento. Solicite ayuda de inmediato si: Tiene dolor intenso en el abdomen o la espalda. Se desmaya. Tiene sangrado vaginal abundante y cogulos de sangre que empapan una compresa higinica en menos de 1 hora. Siente falta de aire o Tourist information centre manager. Siente dolor o tiene hinchazn o enrojecimiento en la  pierna. Estos sntomas pueden representar un problema grave que constituye Engineer, maintenance (IT). No espere a ver si los sntomas desaparecen. Solicite atencin mdica de inmediato. Comunquese con el servicio de emergencias de su localidad (911 en los [place]). No conduzca por sus propios medios [employer]. Resumen Despus del procedimiento, es normal tener dolor, sangrado vaginal, estreimiento, dificultades temporales para vaciar la vejiga y sentimientos de tristeza u otras emociones. Use los medicamentos de venta libre y los recetados solamente como se lo haya indicado el mdico. Haga reposo como se lo haya indicado el mdico. Retome sus actividades normales segn lo indicado por el mdico. Comunquese con un mdico si el analgsico no le brinda alivio o si tiene [place], mareos o dificultad para orinar varios das despus del procedimiento. Obtenga ayuda de inmediato si siente dolor intenso en el abdomen o en la espalda, o si se desmaya, tiene hemorragia abundante, tiene [employer] pecho o le falta el aire. Esta informacin no tiene Marine scientist  el consejo del mdico. Asegrese de hacerle al mdico cualquier pregunta que tenga. Document Revised: 07/30/2020 Document Reviewed: 06/25/2020 Elsevier Patient Education  2022 [place] general en adultos, cuidados posteriores General Anesthesia, Adult, Care After Esta hoja le brinda informacin sobre cmo cuidarse despus del procedimiento. El mdico tambin podr darle instrucciones ms especficas. Comunquese con el mdico si tiene problemas o preguntas. Qu puedo esperar despus del procedimiento? Luego del procedimiento, son comunes los siguiente efectos secundarios: Dolor o Scientist, research (life sciences) en el lugar de la va intravenosa (i.v.). Nuseas. Vmitos. El dolor de Investment banker, operational. Dificultad para concentrarte. Sentir fro o [employer]. Sensacin de debilidad o cansancio. Somnolencia y Programmer, applications. Malestar y Hydrologist.  Estos efectos secundarios pueden afectar partes del cuerpo que no estuvieron involucradas en la ciruga. Siga estas instrucciones en su casa: Durante el perodo de [employer] le haya indicado el mdico:  Descanse. No participe en actividades que impliquen posibles cadas o lesiones. No conduzca ni opere maquinaria. No beba alcohol. No tome somnferos ni medicamentos que causen somnolencia. No tome decisiones trascendentes ni firme documentos importantes. No cuide a nios por su cuenta. Comida y bebida Siga las indicaciones del mdico respecto de las restricciones de comidas o bebidas. Cuando [employer], comience a comer cantidades pequeas de alimentos que sean blandos y fciles de Publishing copy (livianos), como una tostada. Retome su dieta habitual de forma gradual. Beber suficiente lquido como para mantener la orina de color amarillo plido. Si vomita, rehidrtese tomando agua, jugo o caldo transparente. Instrucciones generales Si tiene apnea del sueo, la [place] y ciertos medicamentos pueden elevar su riesgo de tener problemas respiratorios. Siga las instrucciones del mdico respecto al uso del dispositivo para dormir: Siempre que duerma, [place] siestas que tome en [employer]. Mientras tome analgsicos recetados, medicamentos para dormir o medicamentos que producen somnolencia. Pida a un adulto responsable que se quede con usted durante el tiempo que se le indique. Es importante tener a alguien que lo ayude a cuidarse hasta que est despierto y Press photographer. Retome sus actividades normales segn lo indicado por el mdico. Pregntele al mdico qu actividades son seguras para usted. Use los medicamentos de venta libre y los recetados solamente como se lo haya indicado el mdico. Si fuma, no lo haga sin supervisin. Concurra a todas las visitas de seguimiento como se lo haya indicado el mdico. Esto es importante. Comunquese con un mdico si: Tiene nuseas o vmitos que no mejoran  con medicamentos. No puede comer ni beber sin vomitar. Su dolor no se alivia con medicamentos. No puede orinar. Tiene una erupcin cutnea. Tiene fiebre. Presenta enrojecimiento alrededor del lugar de la va intravenosa (i.v.) que empeora. Solicite ayuda de inmediato si: Tienes dificultad para respirar. Sientes dolor en el pecho. Observa sangre en la orina o heces, o vomita sangre. Resumen Despus del procedimiento, es comn tener dolor de garganta y nuseas. Tambin es comn sentirse cansado. Pida a un adulto responsable que se quede con usted durante el tiempo que se le indique. Es importante tener a alguien que lo ayude a cuidarse hasta que est despierto y Press photographer. Cuando [employer], comience a comer cantidades pequeas de alimentos que sean blandos y fciles de Publishing copy (livianos), como una tostada. Retome su dieta habitual de forma gradual. Beber suficiente lquido como para mantener la orina de color amarillo plido. Retome sus actividades normales segn lo indicado por el mdico. Pregntele al mdico qu actividades son seguras para usted. Esta informacin no tiene Marine scientist el consejo del  mdico. Asegrese de hacerle al mdico cualquier pregunta que tenga. Document Revised: 03/28/2020 Document Reviewed: 03/28/2020 Elsevier Patient Education  2022 [place]  11/17/2021     @[email or web address] @   Your procedure is scheduled on  11/19/2021.   Report to [facility] at  [phone] A.M.   Call this number if you have problems the morning of surgery:  [phone]   Remember:  Do not eat after midnight.   You may drink clear liquids until  0330 AM.    Clear liquids allowed are:                    Water, Juice (non-citric and without pulp - diabetics please choose diet or no sugar options), Carbonated beverages - (diabetics please choose diet or no sugar options), Clear Tea, Black Coffee only (no creamer, milk or cream including half and  half), Plain Jell-O only (diabetics please choose diet or no sugar options), Gatorade (diabetics please choose diet or no sugar options), and Plain Popsicles only       At 0330 am, drink your carb drink. You can have nothing else to drink after this.    Take these medicines the morning of surgery with A SIP OF WATER                                                None     Do not wear jewelry, make-up or nail polish.  Do not wear lotions, powders, or perfumes, or deodorant.  Do not shave 48 hours prior to surgery.  Men may shave face and neck.  Do not bring valuables to the hospital.  [facility] is not responsible for any belongings or valuables.  Contacts, dentures or bridgework may not be worn into surgery.  Leave your suitcase in the car.  After surgery it may be brought to your room.  For patients admitted to the hospital, discharge time will be determined by your treatment team.  Patients discharged the day of surgery will not be allowed to drive home and must have someone with them for 24 hours.    Special instructions:   DO NOT smoke tobacco or vape for 24 hours before your procedure.  Please read over the following fact sheets that you were given. Pain Booklet, Coughing and Deep Breathing, Surgical Site Infection Prevention, Anesthesia Post-op Instructions, and Care and Recovery After Surgery      Vaginal Hysterectomy, Care After The following information offers guidance on how to care for yourself after your procedure. Your health care provider may also give you more specific instructions. If you have problems or questions, contact your health care provider. What can I expect after the procedure? After the procedure, it is common to have: Pain in the lower abdomen and vagina. Vaginal bleeding and discharge for up to 1 week. You will need to use a sanitary pad after this procedure. Difficulty having a bowel movement (constipation). Temporary problems emptying the  bladder. Tiredness (fatigue). Poor appetite. Less interest in sex. Feelings of sadness or other emotions. If your ovaries were also removed, it is also common to have symptoms of menopause, such as hot flashes, night sweats, and lack of sleep (insomnia). Follow these instructions at home: Medicines  Take over-the-counter and prescription medicines only as told by your health care provider.  Do not take aspirin or NSAIDs, such as ibuprofen. These medicines can cause bleeding. Ask your health care provider if the medicine prescribed to you: Requires you to avoid driving or using machinery. Can cause constipation. You may need to take these actions to prevent or treat constipation: Drink enough fluid to keep your urine pale yellow. Take over-the-counter or prescription medicines. Eat foods that are high in fiber, such as beans, whole grains, and fresh fruits and vegetables. Limit foods that are high in fat and processed sugars, such as fried or sweet foods. Activity  Rest as told by your health care provider. Return to your normal activities as told by your health care provider. Ask your health care provider what activities are safe for you Avoid sitting for a long time without moving. Get up to take short walks every 1-2 hours. This is important to improve blood flow and breathing. Ask for help if you feel weak or unsteady. Try to have someone home with you for 1-2 weeks to help you with everyday chores. Do not lift anything that is heavier than 10 lb (4.5 kg), or the limit that you are told, until your health care provider says that it is safe. If you were given a sedative during the procedure, it can affect you for several hours. Do not drive or operate machinery until your health care provider says that it is safe. Lifestyle Do not use any products that contain nicotine or tobacco. These products include cigarettes, chewing tobacco, and vaping devices, such as e-cigarettes. These can delay  healing after surgery. If you need help quitting, ask your health care provider. Do not drink alcohol until your health care provider approves. General instructions Do not douche, use tampons, or have sex for at least 6 weeks, or as told by your health care provider. If you struggle with physical or emotional changes after your procedure, speak with your health care provider or a therapist. The stitches inside your vagina will dissolve over time and do not need to be taken out. Do not take baths, swim, or use a hot tub until your health care provider approves. You may only be allowed to take showers for 2-3 weeks Wear compression stockings as told by your health care provider. These stockings help to prevent blood clots and reduce swelling in your legs. Keep all follow-up visits. This is important. Contact a health care provider if: Your pain medicine is not helping. You have a fever. You have nausea or vomiting that does not go away. You feel dizzy. You have blood, pus, or a bad-smelling discharge from your vagina more than 1 week after the procedure. You continue to have trouble urinating 3-5 days after the procedure. Get help right away if: You have severe pain in your abdomen or back. You faint. You have heavy vaginal bleeding and blood clots, soaking through a sanitary pad in less than 1 hour. You have chest pain or shortness of breath. You have pain, swelling, or redness in your leg. These symptoms may represent a serious problem that is an emergency. Do not wait to see if the symptoms will go away. Get medical help right away. Call your local emergency services (911 in the [place] Do not drive yourself to the hospital. Summary After the procedure, it is common to have pain, vaginal bleeding, constipation, temporary problems emptying your bladder, and feelings of sadness or other emotions. Take over-the-counter and prescription medicines only as told by your health care provider. Rest  as  told by your health care provider. Return to your normal activities as told by your health care provider. Contact a health care provider if your pain medicine is not helping, or you have a fever, dizziness, or trouble urinating several days after the procedure. Get help right away if you have severe pain in your abdomen or back, or if you faint, have heavy bleeding, or have chest pain or shortness of breath. This information is not intended to replace advice given to you by your health care provider. Make sure you discuss any questions you have with your health care provider. Document Revised: 06/07/2020 Document Reviewed: 06/07/2020 Elsevier Patient Education  [place] Anesthesia, Adult, Care After This sheet gives you information about how to care for yourself after your procedure. Your health care provider may also give you more specific instructions. If you have problems or questions, contact your health care provider. What can I expect after the procedure? After the procedure, the following side effects are common: Pain or discomfort at the IV site. Nausea. Vomiting. Sore throat. Trouble concentrating. Feeling cold or chills. Feeling weak or tired. Sleepiness and fatigue. Soreness and body aches. These side effects can affect parts of the body that were not involved in surgery. Follow these instructions at home: For the time period you were told by your health care provider:  Rest. Do not participate in activities where you could fall or become injured. Do not drive or use machinery. Do not drink alcohol. Do not take sleeping pills or medicines that cause drowsiness. Do not make important decisions or sign legal documents. Do not take care of children on your own. Eating and drinking Follow any instructions from your health care provider about eating or drinking restrictions. When you feel hungry, start by eating small amounts of foods that are soft and easy  to digest (bland), such as toast. Gradually return to your regular diet. Drink enough fluid to keep your urine pale yellow. If you vomit, rehydrate by drinking water, juice, or clear broth. General instructions If you have sleep apnea, surgery and certain medicines can increase your risk for breathing problems. Follow instructions from your health care provider about wearing your sleep device: Anytime you are sleeping, including during daytime naps. While taking prescription pain medicines, sleeping medicines, or medicines that make you drowsy. Have a responsible adult stay with you for the time you are told. It is important to have someone help care for you until you are awake and alert. Return to your normal activities as told by your health care provider. Ask your health care provider what activities are safe for you. Take over-the-counter and prescription medicines only as told by your health care provider. If you smoke, do not smoke without supervision. Keep all follow-up visits as told by your health care provider. This is important. Contact a health care provider if: You have nausea or vomiting that does not get better with medicine. You cannot eat or drink without vomiting. You have pain that does not get better with medicine. You are unable to pass urine. You develop a skin rash. You have a fever. You have redness around your IV site that gets worse. Get help right away if: You have difficulty breathing. You have chest pain. You have blood in your urine or stool, or you vomit blood. Summary After the procedure, it is common to have a sore throat or nausea. It is also common to feel tired. Have a responsible adult stay with you  for the time you are told. It is important to have someone help care for you until you are awake and alert. When you feel hungry, start by eating small amounts of foods that are soft and easy to digest (bland), such as toast. Gradually return to your regular  diet. Drink enough fluid to keep your urine pale yellow. Return to your normal activities as told by your health care provider. Ask your health care provider what activities are safe for you. This information is not intended to replace advice given to you by your health care provider. Make sure you discuss any questions you have with your health care provider. Document Revised: 06/20/2020 Document Reviewed: 01/18/2020 Elsevier Patient Education  2022 [place]. How to Use Chlorhexidine for Bathing Chlorhexidine gluconate (CHG) is a germ-killing (antiseptic) solution that is used to clean the skin. It can get rid of the bacteria that normally live on the skin and can keep them away for about 24 hours. To clean your skin with CHG, you may be given: A CHG solution to use in the shower or as part of a sponge bath. A prepackaged cloth that contains CHG. Cleaning your skin with CHG may help lower the risk for infection: While you are staying in the intensive care unit of the hospital. If you have a vascular access, such as a central line, to provide short-term or long-term access to your veins. If you have a catheter to drain urine from your bladder. If you are on a ventilator. A ventilator is a machine that helps you breathe by moving air in and out of your lungs. After surgery. What are the risks? Risks of using CHG include: A skin reaction. Hearing loss, if CHG gets in your ears and you have a perforated eardrum. Eye injury, if CHG gets in your eyes and is not rinsed out. The CHG product catching fire. Make sure that you avoid smoking and flames after applying CHG to your skin. Do not use CHG: If you have a chlorhexidine allergy or have previously reacted to chlorhexidine. On babies younger than 3 months of age. How to use CHG solution Use CHG only as told by your health care provider, and follow the instructions on the label. Use the full amount of CHG as directed. Usually, this is one  bottle. During a shower Follow these steps when using CHG solution during a shower (unless your health care provider gives you different instructions): Start the shower. Use your normal soap and shampoo to wash your face and hair. Turn off the shower or move out of the shower stream. Pour the CHG onto a clean washcloth. Do not use any type of brush or rough-edged sponge. Starting at your neck, lather your body down to your toes. Make sure you follow these instructions: If you will be having surgery, pay special attention to the part of your body where you will be having surgery. Scrub this area for at least 1 minute. Do not use CHG on your head or face. If the solution gets into your ears or eyes, rinse them well with water. Avoid your genital area. Avoid any areas of skin that have broken skin, cuts, or scrapes. Scrub your back and under your arms. Make sure to wash skin folds. Let the lather sit on your skin for 1-2 minutes or as long as told by your health care provider. Thoroughly rinse your entire body in the shower. Make sure that all body creases and crevices are rinsed well.  Dry off with a clean towel. Do not put any substances on your body afterward--such as powder, lotion, or perfume--unless you are told to do so by your health care provider. Only use lotions that are recommended by the manufacturer. Put on clean clothes or pajamas. If it is the night before your surgery, sleep in clean sheets.  During a sponge bath Follow these steps when using CHG solution during a sponge bath (unless your health care provider gives you different instructions): Use your normal soap and shampoo to wash your face and hair. Pour the CHG onto a clean washcloth. Starting at your neck, lather your body down to your toes. Make sure you follow these instructions: If you will be having surgery, pay special attention to the part of your body where you will be having surgery. Scrub this area for at least 1  minute. Do not use CHG on your head or face. If the solution gets into your ears or eyes, rinse them well with water. Avoid your genital area. Avoid any areas of skin that have broken skin, cuts, or scrapes. Scrub your back and under your arms. Make sure to wash skin folds. Let the lather sit on your skin for 1-2 minutes or as long as told by your health care provider. Using a different clean, wet washcloth, thoroughly rinse your entire body. Make sure that all body creases and crevices are rinsed well. Dry off with a clean towel. Do not put any substances on your body afterward--such as powder, lotion, or perfume--unless you are told to do so by your health care provider. Only use lotions that are recommended by the manufacturer. Put on clean clothes or pajamas. If it is the night before your surgery, sleep in clean sheets. How to use CHG prepackaged cloths Only use CHG cloths as told by your health care provider, and follow the instructions on the label. Use the CHG cloth on clean, dry skin. Do not use the CHG cloth on your head or face unless your health care provider tells you to. When washing with the CHG cloth: Avoid your genital area. Avoid any areas of skin that have broken skin, cuts, or scrapes. Before surgery Follow these steps when using a CHG cloth to clean before surgery (unless your health care provider gives you different instructions): Using the CHG cloth, vigorously scrub the part of your body where you will be having surgery. Scrub using a back-and-forth motion for 3 minutes. The area on your body should be completely wet with CHG when you are done scrubbing. Do not rinse. Discard the cloth and let the area air-dry. Do not put any substances on the area afterward, such as powder, lotion, or perfume. Put on clean clothes or pajamas. If it is the night before your surgery, sleep in clean sheets.  For general bathing Follow these steps when using CHG cloths for general  bathing (unless your health care provider gives you different instructions). Use a separate CHG cloth for each area of your body. Make sure you wash between any folds of skin and between your fingers and toes. Wash your body in the following order, switching to a new cloth after each step: The front of your neck, shoulders, and chest. Both of your arms, under your arms, and your hands. Your stomach and groin area, avoiding the genitals. Your right leg and foot. Your left leg and foot. The back of your neck, your back, and your buttocks. Do not rinse. Discard the cloth and  let the area air-dry. Do not put any substances on your body afterward--such as powder, lotion, or perfume--unless you are told to do so by your health care provider. Only use lotions that are recommended by the manufacturer. Put on clean clothes or pajamas. Contact a health care provider if: Your skin gets irritated after scrubbing. You have questions about using your solution or cloth. You swallow any chlorhexidine. Call your local poison control center (1-667-870-8866 in the [place] Get help right away if: Your eyes itch badly, or they become very red or swollen. Your skin itches badly and is red or swollen. Your hearing changes. You have trouble seeing. You have swelling or tingling in your mouth or throat. You have trouble breathing. These symptoms may represent a serious problem that is an emergency. Do not wait to see if the symptoms will go away. Get medical help right away. Call your local emergency services (911 in the [place] Do not drive yourself to the hospital. Summary Chlorhexidine gluconate (CHG) is a germ-killing (antiseptic) solution that is used to clean the skin. Cleaning your skin with CHG may help to lower your risk for infection. You may be given CHG to use for bathing. It may be in a bottle or in a prepackaged cloth to use on your skin. Carefully follow your health care provider's instructions and the  instructions on the product label. Do not use CHG if you have a chlorhexidine allergy. Contact your health care provider if your skin gets irritated after scrubbing. This information is not intended to replace advice given to you by your health care provider. Make sure you discuss any questions you have with your health care provider. Document Revised: 12/16/2020 Document Reviewed: 12/16/2020 Elsevier Patient Education  2022 [employer].

## 2021-11-17 NOTE — Pre-Procedure Instructions (Signed)
Critical H&H of 6.8/24.8 messaged to Dr Elonda Husky. Orders received from Dr Elonda Husky.

## 2021-11-18 LAB — SARS CORONAVIRUS 2 (TAT 6-24 HRS): SARS Coronavirus 2: NEGATIVE

## 2021-11-18 NOTE — OR Nursing (Signed)
Patient to receive 2 units PRBCs prior to surgery. Have 2 additional units available.per verbal order Dr. Elonda Husky.

## 2021-11-19 ENCOUNTER — Other Ambulatory Visit: Payer: Self-pay

## 2021-11-19 ENCOUNTER — Ambulatory Visit (HOSPITAL_COMMUNITY): Payer: No Typology Code available for payment source | Admitting: Anesthesiology

## 2021-11-19 ENCOUNTER — Encounter (HOSPITAL_COMMUNITY)
Admission: RE | Disposition: A | Discharge status: Home / Self Care | Payer: Self-pay | Source: Home / Self Care | Attending: Obstetrics & Gynecology

## 2021-11-19 ENCOUNTER — Ambulatory Visit (HOSPITAL_COMMUNITY)
Admission: RE | Admit: 2021-11-19 | Discharge: 2021-11-19 | Disposition: A | Discharge status: Home / Self Care | Payer: No Typology Code available for payment source | Attending: Obstetrics & Gynecology | Admitting: Obstetrics & Gynecology

## 2021-11-19 DIAGNOSIS — D509 Iron deficiency anemia, unspecified: Secondary | ICD-10-CM | POA: Insufficient documentation

## 2021-11-19 DIAGNOSIS — N920 Excessive and frequent menstruation with regular cycle: Secondary | ICD-10-CM

## 2021-11-19 DIAGNOSIS — N921 Excessive and frequent menstruation with irregular cycle: Secondary | ICD-10-CM | POA: Insufficient documentation

## 2021-11-19 DIAGNOSIS — N946 Dysmenorrhea, unspecified: Secondary | ICD-10-CM

## 2021-11-19 DIAGNOSIS — D259 Leiomyoma of uterus, unspecified: Secondary | ICD-10-CM | POA: Insufficient documentation

## 2021-11-19 DIAGNOSIS — N888 Other specified noninflammatory disorders of cervix uteri: Secondary | ICD-10-CM | POA: Insufficient documentation

## 2021-11-19 HISTORY — PX: VAGINAL HYSTERECTOMY: SHX2639

## 2021-11-19 LAB — HEMOGLOBIN AND HEMATOCRIT, BLOOD
HCT: 30.7 % — ABNORMAL LOW (ref 36.0–46.0)
Hemoglobin: 9.2 g/dL — ABNORMAL LOW (ref 12.0–15.0)

## 2021-11-19 SURGERY — HYSTERECTOMY, VAGINAL
Anesthesia: General | Site: Vagina

## 2021-11-19 MED ORDER — KETOROLAC TROMETHAMINE 30 MG/ML IJ SOLN
30.0000 mg | Freq: Once | INTRAMUSCULAR | Status: AC
Start: 2021-11-19 — End: 2021-11-19
  Administered 2021-11-19: 30 mg via INTRAMUSCULAR

## 2021-11-19 MED ORDER — BUPIVACAINE HCL (PF) 0.5 % IJ SOLN
INTRAMUSCULAR | Status: AC
  Filled 2021-11-19: qty 30

## 2021-11-19 MED ORDER — OXYCODONE-ACETAMINOPHEN 7.5-325 MG PO TABS: 1.0000 | ORAL_TABLET | ORAL | 0 refills | Status: DC | PRN

## 2021-11-19 MED ORDER — LIDOCAINE HCL (PF) 2 % IJ SOLN
INTRAMUSCULAR | Status: AC
  Filled 2021-11-19: qty 5

## 2021-11-19 MED ORDER — PROPOFOL 10 MG/ML IV BOLUS
INTRAVENOUS | Status: DC | PRN
  Administered 2021-11-19: 150 mg via INTRAVENOUS

## 2021-11-19 MED ORDER — ONDANSETRON HCL 4 MG/2ML IJ SOLN: 4.0000 mg | Freq: Once | INTRAMUSCULAR | Status: DC | PRN

## 2021-11-19 MED ORDER — SODIUM CHLORIDE 0.9 % IR SOLN
Status: DC | PRN
  Administered 2021-11-19: 3000 mL

## 2021-11-19 MED ORDER — KETOROLAC TROMETHAMINE 30 MG/ML IJ SOLN
INTRAMUSCULAR | Status: AC
  Filled 2021-11-19: qty 1

## 2021-11-19 MED ORDER — ACETAMINOPHEN 500 MG PO TABS
1000.0000 mg | ORAL_TABLET | Freq: Once | ORAL | Status: AC
  Administered 2021-11-19: 1000 mg via ORAL

## 2021-11-19 MED ORDER — BUPIVACAINE-EPINEPHRINE (PF) 0.5% -1:200000 IJ SOLN
INTRAMUSCULAR | Status: DC | PRN
  Administered 2021-11-19: 30 mL

## 2021-11-19 MED ORDER — IBUPROFEN 800 MG PO TABS: 800.0000 mg | ORAL_TABLET | Freq: Three times a day (TID) | ORAL | 0 refills | Status: DC | PRN

## 2021-11-19 MED ORDER — POVIDONE-IODINE 10 % EX SWAB: 2.0000 "application " | Freq: Once | CUTANEOUS | Status: DC

## 2021-11-19 MED ORDER — ONDANSETRON 8 MG PO TBDP: 8.0000 mg | ORAL_TABLET | Freq: Three times a day (TID) | ORAL | 0 refills | Status: DC | PRN

## 2021-11-19 MED ORDER — ACETAMINOPHEN 500 MG PO TABS
ORAL_TABLET | ORAL | Status: AC
  Filled 2021-11-19: qty 2

## 2021-11-19 MED ORDER — EPHEDRINE 5 MG/ML INJ
INTRAVENOUS | Status: AC
  Filled 2021-11-19: qty 5

## 2021-11-19 MED ORDER — LIDOCAINE 2% (20 MG/ML) 5 ML SYRINGE
INTRAMUSCULAR | Status: DC | PRN
  Administered 2021-11-19: 60 mg via INTRAVENOUS

## 2021-11-19 MED ORDER — PROPOFOL 10 MG/ML IV BOLUS
INTRAVENOUS | Status: AC
  Filled 2021-11-19: qty 20

## 2021-11-19 MED ORDER — HYDROMORPHONE HCL 1 MG/ML IJ SOLN: 0.2500 mg | INTRAMUSCULAR | Status: DC | PRN

## 2021-11-19 MED ORDER — FENTANYL CITRATE PF 50 MCG/ML IJ SOSY: 50.0000 ug | PREFILLED_SYRINGE | INTRAMUSCULAR | Status: DC | PRN

## 2021-11-19 MED ORDER — DIPHENHYDRAMINE HCL 25 MG PO CAPS
25.0000 mg | ORAL_CAPSULE | Freq: Once | ORAL | Status: AC
  Administered 2021-11-19: 25 mg via ORAL

## 2021-11-19 MED ORDER — LACTATED RINGERS IV SOLN: INTRAVENOUS | Status: DC

## 2021-11-19 MED ORDER — ROCURONIUM BROMIDE 10 MG/ML (PF) SYRINGE
PREFILLED_SYRINGE | INTRAVENOUS | Status: AC
  Filled 2021-11-19: qty 10

## 2021-11-19 MED ORDER — ROCURONIUM BROMIDE 10 MG/ML (PF) SYRINGE
PREFILLED_SYRINGE | INTRAVENOUS | Status: DC | PRN
  Administered 2021-11-19: 50 mg via INTRAVENOUS
  Administered 2021-11-19 (×2): 10 mg via INTRAVENOUS

## 2021-11-19 MED ORDER — HYDROMORPHONE HCL 1 MG/ML IJ SOLN: 0.5000 mg | INTRAMUSCULAR | Status: DC | PRN

## 2021-11-19 MED ORDER — MEPERIDINE HCL 50 MG/ML IJ SOLN: 6.2500 mg | INTRAMUSCULAR | Status: DC | PRN

## 2021-11-19 MED ORDER — SODIUM CHLORIDE 0.9% IV SOLUTION
Freq: Once | INTRAVENOUS | Status: AC
  Administered 2021-11-19: 50 mL/h via INTRAVENOUS

## 2021-11-19 MED ORDER — SUGAMMADEX SODIUM 200 MG/2ML IV SOLN
INTRAVENOUS | Status: DC | PRN
  Administered 2021-11-19: 160 mg via INTRAVENOUS

## 2021-11-19 MED ORDER — FENTANYL CITRATE (PF) 100 MCG/2ML IJ SOLN
INTRAMUSCULAR | Status: AC
  Filled 2021-11-19: qty 2

## 2021-11-19 MED ORDER — SODIUM CHLORIDE 0.9 % IV SOLN
25.0000 mg | Freq: Once | INTRAVENOUS | Status: DC
  Filled 2021-11-19: qty 1

## 2021-11-19 MED ORDER — CIPROFLOXACIN HCL 500 MG PO TABS: 500.0000 mg | ORAL_TABLET | Freq: Two times a day (BID) | ORAL | 0 refills | Status: DC

## 2021-11-19 MED ORDER — MIDAZOLAM HCL 5 MG/5ML IJ SOLN
INTRAMUSCULAR | Status: DC | PRN
  Administered 2021-11-19: 1 mg via INTRAVENOUS

## 2021-11-19 MED ORDER — DIPHENHYDRAMINE HCL 25 MG PO CAPS
ORAL_CAPSULE | ORAL | Status: AC
  Filled 2021-11-19: qty 1

## 2021-11-19 MED ORDER — STERILE WATER FOR IRRIGATION IR SOLN
Status: DC | PRN
  Administered 2021-11-19 (×2): 500 mL

## 2021-11-19 MED ORDER — FENTANYL CITRATE (PF) 100 MCG/2ML IJ SOLN
INTRAMUSCULAR | Status: DC | PRN
  Administered 2021-11-19 (×2): 25 ug via INTRAVENOUS

## 2021-11-19 MED ORDER — PHENYLEPHRINE HCL (PRESSORS) 10 MG/ML IV SOLN
INTRAVENOUS | Status: DC | PRN
Start: 2021-11-19 — End: 2021-11-19
  Administered 2021-11-19: 40 ug via INTRAVENOUS

## 2021-11-19 MED ORDER — CHLORHEXIDINE GLUCONATE 0.12 % MT SOLN
15.0000 mL | Freq: Once | OROMUCOSAL | Status: AC
  Administered 2021-11-19: 15 mL via OROMUCOSAL

## 2021-11-19 MED ORDER — ONDANSETRON HCL 4 MG/2ML IJ SOLN
INTRAMUSCULAR | Status: DC | PRN
  Administered 2021-11-19: 4 mg via INTRAVENOUS

## 2021-11-19 MED ORDER — DEXAMETHASONE SODIUM PHOSPHATE 10 MG/ML IJ SOLN
INTRAMUSCULAR | Status: AC
  Filled 2021-11-19: qty 1

## 2021-11-19 MED ORDER — KETOROLAC TROMETHAMINE 10 MG PO TABS: 10.0000 mg | ORAL_TABLET | Freq: Three times a day (TID) | ORAL | 0 refills | Status: DC | PRN

## 2021-11-19 MED ORDER — CEFAZOLIN SODIUM-DEXTROSE 2-4 GM/100ML-% IV SOLN
2.0000 g | INTRAVENOUS | Status: AC
  Administered 2021-11-19: 2 g via INTRAVENOUS
  Filled 2021-11-19: qty 100

## 2021-11-19 MED ORDER — MIDAZOLAM HCL 2 MG/2ML IJ SOLN
INTRAMUSCULAR | Status: AC
  Filled 2021-11-19: qty 2

## 2021-11-19 MED ORDER — KETOROLAC TROMETHAMINE 30 MG/ML IJ SOLN
30.0000 mg | Freq: Once | INTRAMUSCULAR | Status: AC
  Administered 2021-11-19: 30 mg via INTRAVENOUS
  Filled 2021-11-19: qty 1

## 2021-11-19 MED ORDER — ORAL CARE MOUTH RINSE: 15.0000 mL | Freq: Once | OROMUCOSAL | Status: AC

## 2021-11-19 MED ORDER — HYDROMORPHONE HCL 1 MG/ML IJ SOLN
INTRAMUSCULAR | Status: AC
  Filled 2021-11-19: qty 1

## 2021-11-19 MED ORDER — 0.9 % SODIUM CHLORIDE (POUR BTL) OPTIME
TOPICAL | Status: DC | PRN
  Administered 2021-11-19: 1000 mL

## 2021-11-19 MED ORDER — SCOPOLAMINE 1 MG/3DAYS TD PT72
MEDICATED_PATCH | TRANSDERMAL | Status: AC
  Administered 2021-11-19: 1.5 mg
  Filled 2021-11-19: qty 1

## 2021-11-19 MED ORDER — HYDROMORPHONE HCL 1 MG/ML IJ SOLN
INTRAMUSCULAR | Status: DC | PRN
  Administered 2021-11-19: 1 mg via INTRAVENOUS

## 2021-11-19 MED ORDER — OXYCODONE-ACETAMINOPHEN 7.5-325 MG PO TABS: 1.0000 | ORAL_TABLET | ORAL | Status: DC | PRN

## 2021-11-19 MED ORDER — DEXAMETHASONE SODIUM PHOSPHATE 10 MG/ML IJ SOLN
INTRAMUSCULAR | Status: DC | PRN
  Administered 2021-11-19: 10 mg via INTRAVENOUS

## 2021-11-19 MED ORDER — ONDANSETRON HCL 4 MG/2ML IJ SOLN
INTRAMUSCULAR | Status: AC
  Filled 2021-11-19: qty 2

## 2021-11-19 MED ORDER — PHENYLEPHRINE 40 MCG/ML (10ML) SYRINGE FOR IV PUSH (FOR BLOOD PRESSURE SUPPORT)
PREFILLED_SYRINGE | INTRAVENOUS | Status: AC
  Filled 2021-11-19: qty 10

## 2021-11-19 SURGICAL SUPPLY — 46 items
APPLIER CLIP 13 LRG OPEN (CLIP)
APR CLP LRG 13 20 CLIP (CLIP)
BAG HAMPER (MISCELLANEOUS) ×3 IMPLANT
BLADE SURG SZ10 CARB STEEL (BLADE) IMPLANT
CLIP APPLIE 13 LRG OPEN (CLIP) IMPLANT
CLOTH BEACON ORANGE TIMEOUT ST (SAFETY) ×3 IMPLANT
COVER LIGHT HANDLE STERIS (MISCELLANEOUS) ×6 IMPLANT
DECANTER SPIKE VIAL GLASS SM (MISCELLANEOUS) ×3 IMPLANT
DRAPE HALF SHEET 40X57 (DRAPES) ×3 IMPLANT
DRAPE STERI URO 9X17 APER PCH (DRAPES) ×3 IMPLANT
ELECT REM PT RETURN 9FT ADLT (ELECTROSURGICAL) ×2
ELECTRODE REM PT RTRN 9FT ADLT (ELECTROSURGICAL) ×2 IMPLANT
GAUZE 4X4 16PLY ~~LOC~~+RFID DBL (SPONGE) ×4 IMPLANT
GLOVE ECLIPSE 8.0 STRL XLNG CF (GLOVE) ×3 IMPLANT
GLOVE SRG 8 PF TXTR STRL LF DI (GLOVE) ×2 IMPLANT
GLOVE SURG UNDER POLY LF SZ7 (GLOVE) ×9 IMPLANT
GLOVE SURG UNDER POLY LF SZ8 (GLOVE) ×2
GOWN STRL REUS W/TWL LRG LVL3 (GOWN DISPOSABLE) ×6 IMPLANT
GOWN STRL REUS W/TWL XL LVL3 (GOWN DISPOSABLE) ×3 IMPLANT
IV NS IRRIG 3000ML ARTHROMATIC (IV SOLUTION) ×3 IMPLANT
KIT BLADEGUARD II DBL (SET/KITS/TRAYS/PACK) ×3 IMPLANT
KIT TURNOVER CYSTO (KITS) ×3 IMPLANT
MANIFOLD NEPTUNE II (INSTRUMENTS) ×3 IMPLANT
NDL HYPO 18GX1.5 BLUNT FILL (NEEDLE) IMPLANT
NDL HYPO 21X1.5 SAFETY (NEEDLE) ×2 IMPLANT
NEEDLE HYPO 18GX1.5 BLUNT FILL (NEEDLE) ×2 IMPLANT
NEEDLE HYPO 21X1.5 SAFETY (NEEDLE) ×2 IMPLANT
NS IRRIG 1000ML POUR BTL (IV SOLUTION) ×3 IMPLANT
PACK PERI GYN (CUSTOM PROCEDURE TRAY) ×3 IMPLANT
PAD ARMBOARD 7.5X6 YLW CONV (MISCELLANEOUS) ×3 IMPLANT
PENCIL SMOKE EVACUATOR (MISCELLANEOUS) ×3 IMPLANT
SET BASIN LINEN APH (SET/KITS/TRAYS/PACK) ×3 IMPLANT
SPONGE T-LAP 4X18 ~~LOC~~+RFID (SPONGE) ×1 IMPLANT
SUT MNCRL+ AB 3-0 CT1 36 (SUTURE) IMPLANT
SUT MON AB 3-0 SH 27 (SUTURE) IMPLANT
SUT MONOCRYL AB 3-0 CT1 36IN (SUTURE)
SUT VIC AB 0 CT1 27 (SUTURE) ×6
SUT VIC AB 0 CT1 27XCR 8 STRN (SUTURE) ×4 IMPLANT
SUT VIC AB 0 CT1 36 (SUTURE) ×1 IMPLANT
SYR 50ML LL SCALE MARK (SYRINGE) ×1 IMPLANT
SYR CONTROL 10ML LL (SYRINGE) ×3 IMPLANT
TRAY FOLEY W/BAG SLVR 16FR (SET/KITS/TRAYS/PACK) ×2
TRAY FOLEY W/BAG SLVR 16FR ST (SET/KITS/TRAYS/PACK) IMPLANT
VERSALIGHT (MISCELLANEOUS) ×3 IMPLANT
WATER STERILE IRR 1000ML POUR (IV SOLUTION) ×3 IMPLANT
WATER STERILE IRR 500ML POUR (IV SOLUTION) ×2 IMPLANT

## 2021-11-19 NOTE — Anesthesia Procedure Notes (Signed)
Procedure Name: Intubation Date/Time: 11/19/2021 10:00 AM Performed by: Maude Leriche, CRNA Pre-anesthesia Checklist: Patient identified, Emergency Drugs available, Suction available and Patient being monitored Patient Re-evaluated:Patient Re-evaluated prior to induction Oxygen Delivery Method: Circle system utilized Preoxygenation: Pre-oxygenation with 100% oxygen Induction Type: IV induction Ventilation: Mask ventilation without difficulty Laryngoscope Size: Miller and 2 Grade View: Grade I Tube type: Oral Tube size: 7.0 mm Number of attempts: 1 Airway Equipment and Method: Stylet and Bite block Placement Confirmation: ETT inserted through vocal cords under direct vision, positive ETCO2 and breath sounds checked- equal and bilateral Secured at: 21 cm Tube secured with: Tape Dental Injury: Teeth and Oropharynx as per pre-operative assessment

## 2021-11-19 NOTE — Anesthesia Postprocedure Evaluation (Signed)
Anesthesia Post Note  Patient: Katherine [name]  Procedure(s) Performed: TOTAL HYSTERECTOMY VAGINAL (Vagina )  Patient location during evaluation: Phase II Anesthesia Type: General Level of consciousness: awake and alert and oriented Pain management: pain level controlled Vital Signs Assessment: post-procedure vital signs reviewed and stable Respiratory status: spontaneous breathing, nonlabored ventilation and respiratory function stable Cardiovascular status: blood pressure returned to baseline and stable Postop Assessment: no apparent nausea or vomiting Anesthetic complications: no   No notable events documented.   Last Vitals:  Vitals:   11/19/21 1315 11/19/21 1339  BP: 116/75 130/81  Pulse: 78 78  Resp: 17 17  Temp:  36.6 C  SpO2: 100% 100%    Last Pain:  Vitals:   11/19/21 1339  TempSrc: Oral  PainSc: 5                  Leocadia Idleman C Adryan Shin

## 2021-11-19 NOTE — H&P (Signed)
Preoperative History and Physical  Katherine [name] is a 41 y.o. [id number] with No LMP recorded. (Menstrual status: Oral contraceptives). admitted for a vaginal hysterectomy.   Long history of menometrorrhagia and anemia Have not been able to manage her cycles with either megestrol depo provera or COC Hemoglobin today 7.4(08/21/21) Her periods are better but still has clots  Did not respond to megestrol or COC over the years Pre op hemoglobin 6.8  PMH:    Past Medical History:  Diagnosis Date   Abnormal uterine bleeding    Anemia    Anxiety    Hemorrhoids    Hyperlipidemia     PSH:     Past Surgical History:  Procedure Laterality Date   HEMORRHOID SURGERY  12/12/2010   Dr Greer Pickerel; excision of rt anterior mixed column hemorrhoid, rubber band ligation of Rt lateral int hemorrhoid    POb/GynH:      OB History     Gravida  5   Para  4   Term  3   Preterm  1   AB  1   Living  3      SAB  1   IAB      Ectopic      Multiple      Live Births              SH:   Social History   Tobacco Use   Smoking status: Never   Smokeless tobacco: Never  Vaping Use   Vaping Use: Never used  Substance Use Topics   Alcohol use: No   Drug use: No    FH:    Family History  Problem Relation Age of Onset   Diabetes Father    Hypertension Mother    Diabetes Sister      Allergies: No Known Allergies  Medications:       Current Facility-Administered Medications:    0.9 %  sodium chloride infusion (Manually program via Guardrails IV Fluids), , Intravenous, Once, Florian Buff, MD   ceFAZolin (ANCEF) IVPB 2g/100 mL premix, 2 g, Intravenous, On Call to OR, Florian Buff, MD   chlorhexidine (PERIDEX) 0.12 % solution 15 mL, 15 mL, Mouth/Throat, Once **OR** MEDLINE mouth rinse, 15 mL, Mouth Rinse, Once, Battula, Rajamani C, MD   ketorolac (TORADOL) 30 MG/ML injection 30 mg, 30 mg, Intravenous, Once, Rikki Trosper, Mertie Clause, MD   lactated ringers infusion, ,  Intravenous, Continuous, Battula, Rajamani C, MD   povidone-iodine 10 % swab 2 application, 2 application, Topical, Once, Jamaurie Bernier, Mertie Clause, MD  Review of Systems:   Review of Systems  Constitutional: Negative for fever, chills, weight loss, malaise/fatigue and diaphoresis.  HENT: Negative for hearing loss, ear pain, nosebleeds, congestion, sore throat, neck pain, tinnitus and ear discharge.   Eyes: Negative for blurred vision, double vision, photophobia, pain, discharge and redness.  Respiratory: Negative for cough, hemoptysis, sputum production, shortness of breath, wheezing and stridor.   Cardiovascular: Negative for chest pain, palpitations, orthopnea, claudication, leg swelling and PND.  Gastrointestinal: Positive for abdominal pain. Negative for heartburn, nausea, vomiting, diarrhea, constipation, blood in stool and melena.  Genitourinary: Negative for dysuria, urgency, frequency, hematuria and flank pain.  Musculoskeletal: Negative for myalgias, back pain, joint pain and falls.  Skin: Negative for itching and rash.  Neurological: Negative for dizziness, tingling, tremors, sensory change, speech change, focal weakness, seizures, loss of consciousness, weakness and headaches.  Endo/Heme/Allergies: Negative for environmental allergies and polydipsia. Does not bruise/bleed easily.  Psychiatric/Behavioral: Negative  for depression, suicidal ideas, hallucinations, memory loss and substance abuse. The patient is not nervous/anxious and does not have insomnia.      PHYSICAL EXAM:  Blood pressure 124/73, pulse 73, temperature 98.6 F (37 C), temperature source Oral, resp. rate (!) 23, SpO2 100 %.    Vitals reviewed. Constitutional: She is oriented to person, place, and time. She appears well-developed and well-nourished.  HENT:  Head: Normocephalic and atraumatic.  Right Ear: External ear normal.  Left Ear: External ear normal.  Nose: Nose normal.  Mouth/Throat: Oropharynx is clear and  moist.  Eyes: Conjunctivae and EOM are normal. Pupils are equal, round, and reactive to light. Right eye exhibits no discharge. Left eye exhibits no discharge. No scleral icterus.  Neck: Normal range of motion. Neck supple. No tracheal deviation present. No thyromegaly present.  Cardiovascular: Normal rate, regular rhythm, normal heart sounds and intact distal pulses.  Exam reveals no gallop and no friction rub.   No murmur heard. Respiratory: Effort normal and breath sounds normal. No respiratory distress. She has no wheezes. She has no rales. She exhibits no tenderness.  GI: Soft. Bowel sounds are normal. She exhibits no distension and no mass. There is tenderness. There is no rebound and no guarding.  Genitourinary:       Vulva is normal without lesions Vagina is pink moist without discharge Cervix normal in appearance and pap is normal Uterus is normal size, contour, position, consistency, mobility, non-tender Adnexa is negative with normal sized ovaries by sonogram  Musculoskeletal: Normal range of motion. She exhibits no edema and no tenderness.  Neurological: She is alert and oriented to person, place, and time. She has normal reflexes. She displays normal reflexes. No cranial nerve deficit. She exhibits normal muscle tone. Coordination normal.  Skin: Skin is warm and dry. No rash noted. No erythema. No pallor.  Psychiatric: She has a normal mood and affect. Her behavior is normal. Judgment and thought content normal.    Labs: Results for orders placed or performed during the hospital encounter of 11/17/21 (from the past 336 hour(s))  Type and screen   Collection Time: 11/17/21 10:29 AM  Result Value Ref Range   ABO/RH(D) O POS    Antibody Screen NEG    Sample Expiration 12/01/2021,2359    Extend sample reason NO TRANSFUSIONS OR PREGNANCY IN THE PAST 3 MONTHS    Unit Number [id number]    Blood Component Type RED CELLS,LR    Unit division 00    Status of Unit ISSUED     Transfusion Status OK TO TRANSFUSE    Crossmatch Result Compatible    Unit Number [id number]    Blood Component Type RED CELLS,LR    Unit division 00    Status of Unit ALLOCATED    Transfusion Status OK TO TRANSFUSE    Crossmatch Result Compatible    Unit Number [id number]    Blood Component Type RED CELLS,LR    Unit division 00    Status of Unit ISSUED    Transfusion Status OK TO TRANSFUSE    Crossmatch Result      Compatible Performed at [facility], [address], [place], [place] [address]    Unit Number [id number]    Blood Component Type RED CELLS,LR    Unit division 00    Status of Unit ALLOCATED    Transfusion Status OK TO TRANSFUSE    Crossmatch Result Compatible   BPAM RBC   Collection Time: 11/17/21 10:29 AM  Result Value Ref  Range   ISSUE DATE / TIME [id number]    Blood Product Unit Number [id number]    PRODUCT CODE E0382V00    Unit Type and Rh 5100    Blood Product Expiration Date 967591638466    Blood Product Unit Number [id number]    PRODUCT CODE E0382V00    Unit Type and Rh 5100    Blood Product Expiration Date 030092330076    ISSUE DATE / TIME 226333545625    Blood Product Unit Number [id number]    PRODUCT CODE E0382V00    Unit Type and Rh 9500    Blood Product Expiration Date 115726203559    Blood Product Unit Number [id number]    PRODUCT CODE E3212Y48    Unit Type and Rh 5100    Blood Product Expiration Date 250037048889   Urinalysis, Routine w reflex microscopic Urine, Clean Catch   Collection Time: 11/17/21 10:49 AM  Result Value Ref Range   Color, Urine YELLOW YELLOW   APPearance CLEAR CLEAR   Specific Gravity, Urine >1.030 (H) 1.005 - 1.030   pH 5.5 5.0 - 8.0   Glucose, UA NEGATIVE NEGATIVE mg/dL   Hgb urine dipstick NEGATIVE NEGATIVE   Bilirubin Urine NEGATIVE NEGATIVE   Ketones, ur NEGATIVE NEGATIVE mg/dL   Protein, ur 100 (A) NEGATIVE mg/dL   Nitrite NEGATIVE NEGATIVE   Leukocytes,Ua NEGATIVE NEGATIVE   Urinalysis, Microscopic (reflex)   Collection Time: 11/17/21 10:49 AM  Result Value Ref Range   RBC / HPF NONE SEEN 0 - 5 RBC/hpf   WBC, UA 0-5 0 - 5 WBC/hpf   Bacteria, UA MANY (A) NONE SEEN   Squamous Epithelial / LPF >50 0 - 5  CBC   Collection Time: 11/17/21 10:51 AM  Result Value Ref Range   WBC 8.6 4.0 - 10.5 K/uL   RBC 3.57 (L) 3.87 - 5.11 MIL/uL   Hemoglobin 6.8 (LL) 12.0 - 15.0 g/dL   HCT 25.7 (L) 36.0 - 46.0 %   MCV 72.0 (L) 80.0 - 100.0 fL   MCH 19.0 (L) 26.0 - 34.0 pg   MCHC 26.5 (L) 30.0 - 36.0 g/dL   RDW 17.3 (H) 11.5 - 15.5 %   Platelets 316 150 - 400 K/uL   nRBC 0.0 0.0 - 0.2 %  Comprehensive metabolic panel   Collection Time: 11/17/21 10:51 AM  Result Value Ref Range   Sodium 133 (L) 135 - 145 mmol/L   Potassium 3.5 3.5 - 5.1 mmol/L   Chloride 108 98 - 111 mmol/L   CO2 21 (L) 22 - 32 mmol/L   Glucose, Bld 116 (H) 70 - 99 mg/dL   BUN 10 6 - 20 mg/dL   Creatinine, Ser 0.70 0.44 - 1.00 mg/dL   Calcium 10.3 8.9 - 10.3 mg/dL   Total Protein 7.2 6.5 - 8.1 g/dL   Albumin 3.4 (L) 3.5 - 5.0 g/dL   AST 154 (H) 15 - 41 U/L   ALT 112 (H) 0 - 44 U/L   Alkaline Phosphatase 82 38 - 126 U/L   Total Bilirubin 0.5 0.3 - 1.2 mg/dL   GFR, Estimated >60 >60 mL/min   Anion gap 4 (L) 5 - 15  hCG, quantitative, pregnancy   Collection Time: 11/17/21 10:51 AM  Result Value Ref Range   hCG, Beta Chain, Quant, S <1 <5 mIU/mL  Rapid HIV screen (HIV 1/2 Ab+Ag)   Collection Time: 11/17/21 10:51 AM  Result Value Ref Range   HIV-1 P24 Antigen - HIV24 NON REACTIVE NON REACTIVE  HIV 1/2 Antibodies NON REACTIVE NON REACTIVE   Interpretation (HIV Ag Ab)      A non reactive test result means that HIV 1 or HIV 2 antibodies and HIV 1 p24 antigen were not detected in the specimen.  Prepare RBC (crossmatch)   Collection Time: 11/17/21 11:27 AM  Result Value Ref Range   Order Confirmation      ORDER PROCESSED BY BLOOD BANK Performed at [facility], [address] [place],   [address]   Results for orders placed or performed during the hospital encounter of 11/17/21 (from the past 336 hour(s))  SARS CORONAVIRUS 2 (TAT 6-24 HRS)   Collection Time: 11/17/21  8:32 PM  Result Value Ref Range   SARS Coronavirus 2 NEGATIVE NEGATIVE    EKG: Orders placed or performed in visit on 11/17/21   EKG 12-Lead   EKG 12-Lead   EKG 12-Lead    Imaging Studies: No results found.    Assessment: Menometrorrhagia Iron deficiency anemia, severe  Plan: Vaginal hysterectomy  Pt understands the risks of surgery including but not limited t  excessive bleeding requiring transfusion or reoperation, post-operative infection requiring prolonged hospitalization or re-hospitalization and antibiotic therapy, and damage to other organs including bladder, bowel, ureters and major vessels.  The patient also understands the alternative treatment options which were discussed in full.  All questions were answered.  Florian Buff 11/19/2021 9:19 AM   Florian Buff 11/19/2021 9:16 AM

## 2021-11-19 NOTE — Anesthesia Preprocedure Evaluation (Addendum)
Anesthesia Evaluation  Patient identified by MRN, date of birth, ID band Patient awake    Reviewed: Allergy & Precautions, NPO status , Patient's Chart, lab work & pertinent test results  Airway Mallampati: II  TM Distance: >3 FB Neck ROM: Full    Dental  (+) Dental Advisory Given, Teeth Intact   Pulmonary    Pulmonary exam normal breath sounds clear to auscultation       Cardiovascular Exercise Tolerance: Good Normal cardiovascular exam Rhythm:Regular Rate:Normal     Neuro/Psych PSYCHIATRIC DISORDERS Anxiety    GI/Hepatic negative GI ROS, Neg liver ROS,   Endo/Other    Renal/GU negative Renal ROS     Musculoskeletal   Abdominal   Peds  Hematology  (+) Blood dyscrasia, anemia ,   Anesthesia Other Findings   Reproductive/Obstetrics                            Anesthesia Physical Anesthesia Plan  ASA: 3  Anesthesia Plan: General   Post-op Pain Management: Dilaudid IV   Induction: Intravenous  PONV Risk Score and Plan: 4 or greater and Ondansetron, Dexamethasone, Midazolam and Scopolamine patch - Pre-op  Airway Management Planned: Oral ETT  Additional Equipment:   Intra-op Plan:   Post-operative Plan: Extubation in OR  Informed Consent: I have reviewed the patients History and Physical, chart, labs and discussed the procedure including the risks, benefits and alternatives for the proposed anesthesia with the patient or authorized representative who has indicated his/her understanding and acceptance.     Dental advisory given  Plan Discussed with: CRNA and Surgeon  Anesthesia Plan Comments:         Anesthesia Quick Evaluation

## 2021-11-19 NOTE — Discharge Instructions (Signed)
Post Operative Pain Med Plan:    >Take the oxycodone 1 tablet, on a schedule, around the clock, every 6 hours(set your phone alarm) for the first 2 days, there may     Be times when you will need 2 tablets but taking them on a schedule will decrease this need  >Then take the oxycodone on an as needed basis per the prescription instructions  >Take the ketorolac or toradol every 8 hours for the first 3 days then the remainder to supplement the oxycodone  >After the toradol is gone then use the ibuprofen as ordered as needed to help supplement the oxycodone  >Use a heating pad as well as needed  >Of course the zofran is available to take as prescribed when needed for nausea  >Be gentle with your diet the first few days, liquids and soft non spicy food, fruits are great  >Get up and move, no lifting or straining  Dr Elonda Husky

## 2021-11-19 NOTE — Op Note (Signed)
Preoperative diagnosis:  1.  Menorrhagia                                         2.  Iron deficiency anemia, severe                                         3.  Unresponsive to conservative management                                           Postoperative diagnosis:  Same as above   Procedure:  Vaginal hysterectomy  Surgeon:  Florian Buff MD  Anesthesia:  General Endotracheal  Findings:  normal uterus tubes and ovaries.  Peritoneum was normal  The patient did receive 2 units PRBC prior to surgery due to pre op hemoglobin of 6.8 Patient and husband expected this necessity    Description of operation:  The patient was taken from the preoperative area to the operating room in stable condition. She was placed in the sitting position and underwent a GETanesthetic. Once an adequate level of anesthesia was attained she was placed in the dorsal lithotomy position. Patient was prepped and draped in the usual sterile fashion and a Foley catheter was placed.  A weighted speculum was placed and the cervix was grasped with thyroid tenaculums both anteriorly and posteriorly.  0.5% Marcaine plain was injected in a circumferential fashion about the cervix and the electrocautery unit was used to incise the vagina and push at all cervix.  The posterior cul-de-sac was then entered sharply without difficulty.  The uterosacral ligaments were clamped cut and inspection suture ligated and held.  The cardinal ligaments were then clamped cut transfixion suture ligated and cut. The anterior peritoneum was identified the anterior cul-de-sac was entered sharply without difficulty. The anterior and posterior leaves of the broad ligament were plicated and the uterine vessels were clamped cut and suture ligated. Serial pedicles were taken of the fundus with each pedicle being clamped cut and suture ligated. The utero-ovarian ligaments were crossclamped the uterus was removed and both pedicles were transfixion suture ligated.  There was good hemostasis of all the pedicles. The peritoneum was then closed in a pursestring fashion using 3-0 Vicryl. The anterior posterior vagina was closed in interrupted fashion with good resultant hemostasis. Prior to  closure the lower pelvis and vagina were irrigated vigorously.  The sponge needle and instrument counts were correct x 3.  Total blood loss for the procedure was 250 cc.  The patient received 2 g of Ancef and 30 mg of Toradol IV preoperatively prophylactically.  She was taken to the recovery room in good stable condition awake alert doing well.  Florian Buff 11/19/2021, 11:55 AM

## 2021-11-19 NOTE — Transfer of Care (Signed)
Immediate Anesthesia Transfer of Care Note  Patient: Katherine [name]  Procedure(s) Performed: TOTAL HYSTERECTOMY VAGINAL (Vagina )  Patient Location: PACU  Anesthesia Type:General  Level of Consciousness: sedated, patient cooperative and responds to stimulation  Airway & Oxygen Therapy: Patient Spontanous Breathing and Patient connected to nasal cannula oxygen  Post-op Assessment: Report given to RN, Post -op Vital signs reviewed and stable, Patient moving all extremities X 4 and Patient able to stick tongue midline  Post vital signs: Reviewed  Last Vitals:  Vitals Value Taken Time  BP 109/71 11/19/21 1158  Temp 98.3   Pulse 80 11/19/21 1159  Resp 11 11/19/21 1159  SpO2 100 % 11/19/21 1159  Vitals shown include unvalidated device data.  Last Pain:  Vitals:   11/19/21 0920  TempSrc: Oral         Complications: No notable events documented.

## 2021-11-20 ENCOUNTER — Encounter (HOSPITAL_COMMUNITY): Payer: Self-pay | Admitting: Obstetrics & Gynecology

## 2021-11-20 LAB — TYPE AND SCREEN
ABO/RH(D): O POS
Antibody Screen: NEGATIVE
Unit division: 0
Unit division: 0
Unit division: 0
Unit division: 0

## 2021-11-20 LAB — BPAM RBC
Blood Product Expiration Date: 202302132359
Blood Product Expiration Date: 202303082359
Blood Product Expiration Date: 202303082359
Blood Product Expiration Date: 202303082359
ISSUE DATE / TIME: 202302010700
ISSUE DATE / TIME: 202302010854
Unit Type and Rh: 5100
Unit Type and Rh: 5100
Unit Type and Rh: 5100
Unit Type and Rh: 9500

## 2021-11-20 LAB — SURGICAL PATHOLOGY

## 2021-12-02 ENCOUNTER — Ambulatory Visit (INDEPENDENT_AMBULATORY_CARE_PROVIDER_SITE_OTHER): Payer: Self-pay | Admitting: Obstetrics & Gynecology

## 2021-12-02 ENCOUNTER — Encounter: Payer: Self-pay | Admitting: Obstetrics & Gynecology

## 2021-12-02 ENCOUNTER — Other Ambulatory Visit: Payer: Self-pay

## 2021-12-02 VITALS — BP 126/83 | HR 81 | Ht 62.0 in | Wt 179.0 lb

## 2021-12-02 DIAGNOSIS — Z9889 Other specified postprocedural states: Secondary | ICD-10-CM

## 2021-12-02 NOTE — Progress Notes (Signed)
°  HPI: Patient returns for routine postoperative follow-up having undergone TVH on 11/19/21.  The patient's immediate postoperative recovery has been unremarkable. Since hospital discharge the patient reports no problems doing well.   Current Outpatient Medications: ibuprofen (ADVIL) 800 MG tablet, Take 1 tablet (800 mg total) by mouth every 8 (eight) hours as needed., Disp: 30 tablet, Rfl: 0 ciprofloxacin (CIPRO) 500 MG tablet, Take 1 tablet (500 mg total) by mouth 2 (two) times daily. (Patient not taking: Reported on 12/02/2021), Disp: 14 tablet, Rfl: 0 ketorolac (TORADOL) 10 MG tablet, Take 1 tablet (10 mg total) by mouth every 8 (eight) hours as needed. (Patient not taking: Reported on 12/02/2021), Disp: 15 tablet, Rfl: 0 ondansetron (ZOFRAN-ODT) 8 MG disintegrating tablet, Take 1 tablet (8 mg total) by mouth every 8 (eight) hours as needed for nausea or vomiting. (Patient not taking: Reported on 12/02/2021), Disp: 8 tablet, Rfl: 0 oxyCODONE-acetaminophen (PERCOCET) 7.5-325 MG tablet, Take 1-2 tablets by mouth every 4 (four) hours as needed for moderate pain. (Patient not taking: Reported on 12/02/2021), Disp: 30 tablet, Rfl: 0  No current facility-administered medications for this visit.    Blood pressure 126/83, pulse 81, height 5\' 2"  (1.575 m), weight 179 lb (81.2 kg), last menstrual period 08/14/2021.  Physical Exam: Abdomen is benign  Diagnostic Tests:   Pathology: benign  Impression:   ICD-10-CM   1. S/P TVH  Z98.890         Plan: No sex    Follow up: Prn    Florian Buff, MD

## 2021-12-30 ENCOUNTER — Other Ambulatory Visit: Payer: Self-pay

## 2021-12-30 ENCOUNTER — Encounter: Payer: Self-pay | Admitting: Obstetrics & Gynecology

## 2021-12-30 ENCOUNTER — Ambulatory Visit (INDEPENDENT_AMBULATORY_CARE_PROVIDER_SITE_OTHER): Payer: Self-pay | Admitting: Obstetrics & Gynecology

## 2021-12-30 VITALS — BP 128/81 | HR 74 | Ht 62.0 in | Wt 186.0 lb

## 2021-12-30 DIAGNOSIS — Z9889 Other specified postprocedural states: Secondary | ICD-10-CM
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
## 2021-12-30 NOTE — Progress Notes (Signed)
?  HPI: ?Patient returns for routine postoperative follow-up having undergone TVH on 11/19/21.  ?The patient's immediate postoperative recovery has been unremarkable. ?Since hospital discharge the patient reports no problems. ? ? ?Current Outpatient Medications: ?ciprofloxacin (CIPRO) 500 MG tablet, Take 1 tablet (500 mg total) by mouth 2 (two) times daily. (Patient not taking: Reported on 12/02/2021), Disp: 14 tablet, Rfl: 0 ?ibuprofen (ADVIL) 800 MG tablet, Take 1 tablet (800 mg total) by mouth every 8 (eight) hours as needed. (Patient not taking: Reported on 12/30/2021), Disp: 30 tablet, Rfl: 0 ?ketorolac (TORADOL) 10 MG tablet, Take 1 tablet (10 mg total) by mouth every 8 (eight) hours as needed. (Patient not taking: Reported on 12/02/2021), Disp: 15 tablet, Rfl: 0 ?ondansetron (ZOFRAN-ODT) 8 MG disintegrating tablet, Take 1 tablet (8 mg total) by mouth every 8 (eight) hours as needed for nausea or vomiting. (Patient not taking: Reported on 12/02/2021), Disp: 8 tablet, Rfl: 0 ?oxyCODONE-acetaminophen (PERCOCET) 7.5-325 MG tablet, Take 1-2 tablets by mouth every 4 (four) hours as needed for moderate pain. (Patient not taking: Reported on 12/02/2021), Disp: 30 tablet, Rfl: 0 ? ?No current facility-administered medications for this visit. ? ? ? ?Blood pressure 128/81, pulse 74, height '5\' 2"'$  (1.575 m), weight 186 lb (84.4 kg), last menstrual period 08/14/2021. ? ?Physical Exam: ?Abdomen is normal ?Vaginal cuff is healing well normal ?Bimanual nontender no masses ? ?Diagnostic Tests: ? ? ?Pathology: ?benign ? ?Impression: ?1. S/P TVH 11/19/21 ? ?  ? ? ?Plan: ?No sex for 3 weeks ? ? ? ?Follow up: ?Prn ? ? ? ?Florian Buff, MD ? ?

## 2022-02-09 ENCOUNTER — Ambulatory Visit: Payer: Self-pay

## 2022-02-09 NOTE — Telephone Encounter (Addendum)
? ? ? ?  Chief Complaint: Leg and hand swelling ?Symptoms: Bi lateral leg and hand swelling. Pain in hands ?Frequency: Since February ?Pertinent Negatives: Patient denies SOB ?Disposition: '[]'$ ED /'[x]'$ Urgent Care (no appt availability in office) / '[]'$ Appointment(In office/virtual)/ '[]'$  [place] [facility]/ '[]'$ Home Care/ '[]'$ Refused Recommended Disposition /'[]'$ [place] [facility]/ '[]'$  Follow-up with PCP ?Additional Notes: Pt will go to [facility] for care. Made follow up appt for pt. May8  ? ? ?Reason for Disposition ? [1] MODERATE leg swelling (e.g., swelling extends up to knees) AND [2] new-onset or worsening ? ?Answer Assessment - Initial Assessment Questions ?1. ONSET: "When did the swelling start?" (e.g., minutes, hours, days) ?    Since February ?2. LOCATION: "What part of the leg is swollen?"  "Are both legs swollen or just one leg?" ?    Both legs  and  ?3. SEVERITY: "How bad is the swelling?" (e.g., localized; mild, moderate, severe) ? - Localized - small area of swelling localized to one leg ? - MILD pedal edema - swelling limited to foot and ankle, pitting edema < 1/4 inch (6 mm) deep, rest and elevation eliminate most or all swelling ? - MODERATE edema - swelling of lower leg to knee, pitting edema > 1/4 inch (6 mm) deep, rest and elevation only partially reduce swelling ? - SEVERE edema - swelling extends above knee, facial or hand swelling present  ?    Knee down swelling ?4. REDNESS: "Does the swelling look red or infected?" ?    no ?5. PAIN: "Is the swelling painful to touch?" If Yes, ask: "How painful is it?"   (Scale 1-10; mild, moderate or severe) ?    Feet no - hands yes ?6. FEVER: "Do you have a fever?" If Yes, ask: "What is it, how was it measured, and when did it start?"  ?    no ?7. CAUSE: "What do you think is causing the leg swelling?" ?    Blood transfusion in february ?8. MEDICAL HISTORY: "Do you have a history of heart failure, kidney disease, liver failure, or cancer?" ?    no ?9. RECURRENT  SYMPTOM: "Have you had leg swelling before?" If Yes, ask: "When was the last time?" "What happened that time?" ?   no  ?10. OTHER SYMPTOMS: "Do you have any other symptoms?" (e.g., chest pain, difficulty breathing) ?      no ?11. PREGNANCY: "Is there any chance you are pregnant?" "When was your last menstrual period?" ?      na ? ?Protocols used: Leg Swelling and Edema-A-AH ? ?

## 2022-02-23 ENCOUNTER — Encounter: Payer: Self-pay | Admitting: Family Medicine

## 2022-02-23 ENCOUNTER — Ambulatory Visit: Payer: Self-pay | Attending: Family Medicine | Admitting: Family Medicine

## 2022-02-23 ENCOUNTER — Other Ambulatory Visit: Payer: Self-pay

## 2022-02-23 VITALS — BP 126/81 | HR 95 | Ht 62.0 in | Wt 181.2 lb

## 2022-02-23 DIAGNOSIS — R609 Edema, unspecified: Secondary | ICD-10-CM

## 2022-02-23 DIAGNOSIS — M79642 Pain in left hand: Secondary | ICD-10-CM

## 2022-02-23 DIAGNOSIS — R7989 Other specified abnormal findings of blood chemistry: Secondary | ICD-10-CM

## 2022-02-23 DIAGNOSIS — Z1159 Encounter for screening for other viral diseases: Secondary | ICD-10-CM

## 2022-02-23 DIAGNOSIS — Z13228 Encounter for screening for other metabolic disorders: Secondary | ICD-10-CM

## 2022-02-23 DIAGNOSIS — M79641 Pain in right hand: Secondary | ICD-10-CM

## 2022-02-23 MED ORDER — NAPROXEN 500 MG PO TABS
500.0000 mg | ORAL_TABLET | Freq: Two times a day (BID) | ORAL | 0 refills | Status: DC
  Filled 2022-02-23: qty 30, 15d supply, fill #0

## 2022-02-23 NOTE — Progress Notes (Signed)
? ?Subjective:  ?Patient ID: Katherine [name], female    DOB: Oct 09, 1981  Age: 41 y.o. MRN: [id number] ? ?CC: Edema ? ? ?HPI ?Katherine [name] is a 41 y.o. year old female status post total vaginal hysterectomy in 11/2021 due to menorrhagia with severe anemia. ? ?Interval History: ?She complains of swelling which started after she had her total vaginal hysterectomy (in 11/2021) occurring in her hands and feet. Symptoms are present through out the whole day and worse with the heat. ?States she has gained weight about 8 lbs since her surgery but her chart reveals a 2 lbs gain instead.  Endorses presence of dyspnea and orthopnea. ?She also complains of pain in her DIP and PIP joints and also pain which travels along her forearm up to her right shoulder on some occasions. ? ?Past Medical History:  ?Diagnosis Date  ? Abnormal uterine bleeding   ? Anemia   ? Anxiety   ? Hemorrhoids   ? Hyperlipidemia   ? ? ?Past Surgical History:  ?Procedure Laterality Date  ? HEMORRHOID SURGERY  12/12/2010  ? Dr Greer Pickerel; excision of rt anterior mixed column hemorrhoid, rubber band ligation of Rt lateral int hemorrhoid  ? VAGINAL HYSTERECTOMY N/A 11/19/2021  ? Procedure: TOTAL HYSTERECTOMY VAGINAL;  Surgeon: Florian Buff, MD;  Location: [facility];  Service: Gynecology;  Laterality: N/A;  ? ? ?Family History  ?Problem Relation Age of Onset  ? Diabetes Father   ? Hypertension Mother   ? Diabetes Sister   ? ? ?Social History  ? ?Socioeconomic History  ? Marital status: Married  ?  Spouse name: Not on file  ? Number of children: Not on file  ? Years of education: Not on file  ? Highest education level: Not on file  ?Occupational History  ? Not on file  ?Tobacco Use  ? Smoking status: Never  ? Smokeless tobacco: Never  ?Vaping Use  ? Vaping Use: Never used  ?Substance and Sexual Activity  ? Alcohol use: No  ? Drug use: No  ? Sexual activity: Yes  ?  Birth control/protection: Pill  ?Other Topics Concern  ? Not on file  ?Social History  Narrative  ? Not on file  ? ?Social Determinants of Health  ? ?Financial Resource Strain: Not on file  ?Food Insecurity: No Food Insecurity  ? Worried About Charity fundraiser in the Last Year: Never true  ? Ran Out of Food in the Last Year: Never true  ?Transportation Needs: No Transportation Needs  ? Lack of Transportation (Medical): No  ? Lack of Transportation (Non-Medical): No  ?Physical Activity: Not on file  ?Stress: Not on file  ?Social Connections: Not on file  ? ? ?No Known Allergies ? ?Outpatient Medications Prior to Visit  ?Medication Sig Dispense Refill  ? ciprofloxacin (CIPRO) 500 MG tablet Take 1 tablet (500 mg total) by mouth 2 (two) times daily. (Patient not taking: Reported on 12/02/2021) 14 tablet 0  ? ibuprofen (ADVIL) 800 MG tablet Take 1 tablet (800 mg total) by mouth every 8 (eight) hours as needed. (Patient not taking: Reported on 12/30/2021) 30 tablet 0  ? ketorolac (TORADOL) 10 MG tablet Take 1 tablet (10 mg total) by mouth every 8 (eight) hours as needed. (Patient not taking: Reported on 12/02/2021) 15 tablet 0  ? ondansetron (ZOFRAN-ODT) 8 MG disintegrating tablet Take 1 tablet (8 mg total) by mouth every 8 (eight) hours as needed for nausea or vomiting. (Patient not taking: Reported on 12/02/2021) 8 tablet  0  ? oxyCODONE-acetaminophen (PERCOCET) 7.5-325 MG tablet Take 1-2 tablets by mouth every 4 (four) hours as needed for moderate pain. (Patient not taking: Reported on 12/02/2021) 30 tablet 0  ? ?No facility-administered medications prior to visit.  ? ? ? ?ROS ?Review of Systems  ?Constitutional:  Negative for activity change, appetite change and fatigue.  ?HENT:  Negative for congestion, sinus pressure and sore throat.   ?Eyes:  Negative for visual disturbance.  ?Respiratory:  Negative for cough, chest tightness, shortness of breath and wheezing.   ?Cardiovascular:  Negative for chest pain and palpitations.  ?Gastrointestinal:  Negative for abdominal distention, abdominal pain and  constipation.  ?Endocrine: Negative for polydipsia.  ?Genitourinary:  Negative for dysuria and frequency.  ?Musculoskeletal:   ?     See HPI  ?Skin:  Negative for rash.  ?Neurological:  Negative for tremors, light-headedness and numbness.  ?Hematological:  Does not bruise/bleed easily.  ?Psychiatric/Behavioral:  Negative for agitation and behavioral problems.   ? ?Objective:  ?BP 126/81   Pulse 95   Ht '5\' 2"'$  (1.575 m)   Wt 181 lb 3.2 oz (82.2 kg)   LMP 08/14/2021   SpO2 98%   BMI 33.14 kg/m?  ? ? ?  02/23/2022  ?  1:47 PM 12/30/2021  ?  8:52 AM 12/02/2021  ?  3:08 PM  ?BP/Weight  ?Systolic BP 161 096 045  ?Diastolic BP 81 81 83  ?Wt. (Lbs) 181.2 186 179  ?BMI 33.14 kg/m2 34.02 kg/m2 32.74 kg/m2  ? ? ? ? ?Physical Exam ?Constitutional:   ?   Appearance: She is well-developed.  ?Cardiovascular:  ?   Rate and Rhythm: Normal rate.  ?   Heart sounds: Normal heart sounds. No murmur heard. ?Pulmonary:  ?   Effort: Pulmonary effort is normal.  ?   Breath sounds: Normal breath sounds. No wheezing or rales.  ?Chest:  ?   Chest wall: No tenderness.  ?Abdominal:  ?   General: Bowel sounds are normal. There is no distension.  ?   Palpations: Abdomen is soft. There is no mass.  ?   Tenderness: There is no abdominal tenderness.  ?Musculoskeletal:     ?   General: Normal range of motion.  ?   Right lower leg: No edema.  ?   Left lower leg: No edema.  ?   Comments: Edema of fingers bilaterally ?Able to make a fist bilaterally ?No tenderness on palpation of DIP or PIP joints ?No tenderness on palpation or range of motion of joints of the upper extremities bilaterally ?Lower extremities are normal  ?Neurological:  ?   Mental Status: She is alert and oriented to person, place, and time.  ?Psychiatric:     ?   Mood and Affect: Mood normal.  ? ? ? ?  Latest Ref Rng & Units 11/17/2021  ? 10:51 AM 03/13/2021  ?  9:51 AM 08/05/2020  ?  5:38 PM  ?CMP  ?Glucose 70 - 99 mg/dL 116   82   95    ?BUN 6 - 20 mg/dL '10   7   7    '$ ?Creatinine 0.44 -  1.00 mg/dL 0.70   0.83   0.77    ?Sodium 135 - 145 mmol/L 133   135   135    ?Potassium 3.5 - 5.1 mmol/L 3.5   4.5   3.4    ?Chloride 98 - 111 mmol/L 108   104   102    ?CO2 22 -  32 mmol/L '21   20   23    '$ ?Calcium 8.9 - 10.3 mg/dL 10.3   10.6   10.5    ?Total Protein 6.5 - 8.1 g/dL 7.2   7.3   8.9    ?Total Bilirubin 0.3 - 1.2 mg/dL 0.5   0.3   0.8    ?Alkaline Phos 38 - 126 U/L 82   63   75    ?AST 15 - 41 U/L 154   27   74    ?ALT 0 - 44 U/L 112   28   55    ? ? ?Lipid Panel  ?   ?Component Value Date/Time  ? CHOL 181 03/13/2021 0951  ? TRIG 132 03/13/2021 0951  ? HDL 48 03/13/2021 0951  ? CHOLHDL 3.8 03/13/2021 0951  ? CHOLHDL 4.9 08/10/2016 0951  ? VLDL 48 (H) 08/10/2016 0951  ? [place] 110 (H) 03/13/2021 0951  ? ? ?CBC ?   ?Component Value Date/Time  ? WBC 8.6 11/17/2021 1051  ? RBC 3.57 (L) 11/17/2021 1051  ? HGB 9.2 (L) 11/19/2021 1250  ? HGB 9.0 (L) 03/13/2021 0951  ? HCT 30.7 (L) 11/19/2021 1250  ? HCT 29.6 (L) 03/13/2021 0951  ? PLT 316 11/17/2021 1051  ? PLT 373 03/13/2021 0951  ? MCV 72.0 (L) 11/17/2021 1051  ? MCV 78 (L) 03/13/2021 0951  ? MCH 19.0 (L) 11/17/2021 1051  ? MCHC 26.5 (L) 11/17/2021 1051  ? RDW 17.3 (H) 11/17/2021 1051  ? RDW 15.4 03/13/2021 0951  ? LYMPHSABS 1.8 03/13/2021 0951  ? MONOABS 0.5 04/05/2013 1805  ? EOSABS 0.1 03/13/2021 0951  ? BASOSABS 0.0 03/13/2021 0951  ? ? ?Lab Results  ?Component Value Date  ? HGBA1C 5.6 03/13/2021  ? ? ?Lab Results  ?Component Value Date  ? TSH 1.683 11/15/2014  ? ? ? ?Assessment & Plan:  ?1. Edema, unspecified type ?Unknown etiology ?We will also check albumin ?Encouraged to comply with a low-sodium diet, elevate feet, use compression stockings ?- Brain natriuretic peptide ? ?2. Pain in both hands ?We will need to exclude rheumatoid versus osteoarthritis ?- Sedimentation rate ?- C-reactive protein ?- Anti-Smith antibody ?- Anti-DNA antibody, double-stranded ?- ANA,IFA RA Diag Pnl w/rflx Tit/Patn ?- naproxen (NAPROSYN) 500 MG tablet; Take 1 tablet (500  mg total) by mouth 2 (two) times daily with a meal.  Dispense: 30 tablet; Refill: 0 ? ?3. Screening for viral disease ?- HCV Ab w Reflex to Quant PCR ? ?4. Screening for metabolic disorder ?- Hemoglobin A1c ?- T4,

## 2022-02-23 NOTE — Progress Notes (Signed)
Has swelling in feet and hands ?Occasional vaginal bleeding. ?

## 2022-02-24 ENCOUNTER — Other Ambulatory Visit: Payer: Self-pay

## 2022-02-26 ENCOUNTER — Telehealth: Payer: Self-pay

## 2022-02-26 ENCOUNTER — Other Ambulatory Visit: Payer: Self-pay | Admitting: Family Medicine

## 2022-02-26 DIAGNOSIS — M79641 Pain in right hand: Secondary | ICD-10-CM

## 2022-02-26 LAB — CMP14+EGFR
ALT: 58 IU/L — ABNORMAL HIGH (ref 0–32)
AST: 54 IU/L — ABNORMAL HIGH (ref 0–40)
Albumin/Globulin Ratio: 1.3 (ref 1.2–2.2)
Albumin: 4.3 g/dL (ref 3.8–4.8)
Alkaline Phosphatase: 103 IU/L (ref 44–121)
BUN/Creatinine Ratio: 9 (ref 9–23)
BUN: 8 mg/dL (ref 6–24)
Bilirubin Total: 0.4 mg/dL (ref 0.0–1.2)
CO2: 22 mmol/L (ref 20–29)
Calcium: 13 mg/dL — ABNORMAL HIGH (ref 8.7–10.2)
Chloride: 105 mmol/L (ref 96–106)
Creatinine, Ser: 0.91 mg/dL (ref 0.57–1.00)
Globulin, Total: 3.4 g/dL (ref 1.5–4.5)
Glucose: 94 mg/dL (ref 70–99)
Potassium: 3.5 mmol/L (ref 3.5–5.2)
Sodium: 140 mmol/L (ref 134–144)
Total Protein: 7.7 g/dL (ref 6.0–8.5)
eGFR: 80 mL/min/{1.73_m2} (ref >59)

## 2022-02-26 LAB — HCV INTERPRETATION

## 2022-02-26 LAB — ANA,IFA RA DIAG PNL W/RFLX TIT/PATN
ANA Titer 1: NEGATIVE
Cyclic Citrullin Peptide Ab: 29 units — ABNORMAL HIGH (ref 0–19)
Rheumatoid fact SerPl-aCnc: <10 IU/mL (ref <14.0)

## 2022-02-26 LAB — HEMOGLOBIN A1C
Est. average glucose Bld gHb Est-mCnc: 120 mg/dL
Hgb A1c MFr Bld: 5.8 % — ABNORMAL HIGH (ref 4.8–5.6)

## 2022-02-26 LAB — C-REACTIVE PROTEIN: CRP: <1 mg/L (ref 0–10)

## 2022-02-26 LAB — BRAIN NATRIURETIC PEPTIDE: BNP: 3.5 pg/mL (ref 0.0–100.0)

## 2022-02-26 LAB — T4, FREE: Free T4: 0.88 ng/dL (ref 0.82–1.77)

## 2022-02-26 LAB — HCV AB W REFLEX TO QUANT PCR: HCV Ab: NONREACTIVE

## 2022-02-26 LAB — TSH: TSH: 3.96 u[IU]/mL (ref 0.450–4.500)

## 2022-02-26 LAB — ANTI-DNA ANTIBODY, DOUBLE-STRANDED: dsDNA Ab: 1 IU/mL (ref 0–9)

## 2022-02-26 LAB — SEDIMENTATION RATE: Sed Rate: 14 mm/hr (ref 0–32)

## 2022-02-26 LAB — ANTI-SMITH ANTIBODY: ENA SM Ab Ser-aCnc: <0.2 AI (ref 0.0–0.9)

## 2022-02-26 NOTE — Telephone Encounter (Signed)
[facility] interpreters Verdis Frederickson  Id# [id number]  contacted pt to go over lab results  pt is aware and doesn't have any questions or concerns  ?

## 2022-09-03 ENCOUNTER — Ambulatory Visit: Payer: Self-pay | Admitting: Rheumatology

## 2023-02-15 ENCOUNTER — Ambulatory Visit: Payer: Self-pay | Admitting: *Deleted

## 2023-02-15 NOTE — Telephone Encounter (Signed)
Feet/ hand swelling, head pressure/chest pain Interpreter: Alanson Puls [phone] Reason for Disposition  [1] Chest pain from known angina comes and goes AND [2] is NOT happening more often (increasing in frequency) or getting worse (increasing in severity)  Answer Assessment - Initial Assessment Questions 1. LOCATION: "Where does it hurt?"       Left at heart 2. RADIATION: "Does the pain go anywhere else?" (e.g., into neck, jaw, arms, back)     no 3. ONSET: "When did the chest pain begin?" (Minutes, hours or days)      2 years- comes and goes- 1-2 times/week 4. PATTERN: "Does the pain come and go, or has it been constant since it started?"  "Does it get worse with exertion?"      Comes and goes 5. DURATION: "How long does it last" (e.g., seconds, minutes, hours)     Pinch- seconds 6. SEVERITY: "How bad is the pain?"  (e.g., Scale 1-10; mild, moderate, or severe)    - MILD (1-3): doesn't interfere with normal activities     - MODERATE (4-7): interferes with normal activities or awakens from sleep    - SEVERE (8-10): excruciating pain, unable to do any normal activities       moderate 7. CARDIAC RISK FACTORS: "Do you have any history of heart problems or risk factors for heart disease?" (e.g., angina, prior heart attack; diabetes, high blood pressure, high cholesterol, smoker, or strong family history of heart disease)     High cholesterol  Answer Assessment - Initial Assessment Questions 1. ONSET: "When did the swelling start?" (e.g., minutes, hours, days)     No new- hand/ feet 2. LOCATION: "What part of the hand is swollen?"  "Are both hands swollen or just one hand?"     Bilateral hand/feet swelling- can happen every day 3. SEVERITY: "How bad is the swelling?" (e.g., localized; mild, moderate, severe)   - BALL OR LUMP: small ball or lump   - LOCALIZED: puffy or swollen area or patch of skin   - JOINT SWELLING: swelling of a joint   - MILD: puffiness or mild swelling of fingers or hand   -  MODERATE: fingers and hand are swollen   - SEVERE: swelling of entire hand and up into forearm     moderate 4. REDNESS: "Does the swelling look red or infected?"     no 5. PAIN: "Is the swelling painful to touch?" If Yes, ask: "How painful is it?"   (Scale 1-10; mild, moderate or severe)     Pain in elbow, feet hurt-wears slips  7. CAUSE: "What do you think is causing the hand swelling?" (e.g., heat, insect bite, pregnancy, recent injury)     unsure  9. RECURRENT SYMPTOM: "Have you had hand swelling before?" If Yes, ask: "When was the last time?" "What happened that time?"     Yes- chronic 10. OTHER SYMPTOMS: "Do you have any other symptoms?" (e.g., blurred vision, difficulty breathing, headache)       Head pressure-pain with laying down-feels she has edema there too  Protocols used: Chest Pain-A-AH, Hand Swelling-A-AH

## 2023-02-15 NOTE — Telephone Encounter (Signed)
  Chief Complaint: multiple complaints: hand/feet swelling. Chest pain/head pressure Symptoms: bilateral hand/feet swelling, chest pain- last second- 1-2 times/week, head pressure when lays down Frequency: chronic symptoms- over 1 year- patient states she has discussed with provider Pertinent Negatives: Patient denies difficulty breathing. Denies pain now in chest Disposition: [] ED /[] Urgent Care (no appt availability in office) / [x] Appointment(In office/virtual)/ []  [facility]/ [] Home Care/ [] Refused Recommended Disposition /[] [facility]/ []  Follow-up with PCP Additional Notes: patient prefers office appointment- first avalible appointment scheduled- patient advised ED for any changes- she understands

## 2023-02-18 ENCOUNTER — Ambulatory Visit: Payer: Self-pay | Admitting: Physician Assistant

## 2023-03-01 ENCOUNTER — Other Ambulatory Visit: Payer: Self-pay

## 2023-03-01 ENCOUNTER — Ambulatory Visit: Payer: Self-pay | Attending: Physician Assistant | Admitting: Physician Assistant

## 2023-03-01 ENCOUNTER — Encounter: Payer: Self-pay | Admitting: Physician Assistant

## 2023-03-01 VITALS — BP 126/84 | HR 70 | Temp 98.5°F | Ht 62.0 in | Wt 198.8 lb

## 2023-03-01 DIAGNOSIS — E559 Vitamin D deficiency, unspecified: Secondary | ICD-10-CM

## 2023-03-01 DIAGNOSIS — R7303 Prediabetes: Secondary | ICD-10-CM

## 2023-03-01 DIAGNOSIS — R609 Edema, unspecified: Secondary | ICD-10-CM

## 2023-03-01 DIAGNOSIS — M62838 Other muscle spasm: Secondary | ICD-10-CM

## 2023-03-01 DIAGNOSIS — D508 Other iron deficiency anemias: Secondary | ICD-10-CM

## 2023-03-01 DIAGNOSIS — Z758 Other problems related to medical facilities and other health care: Secondary | ICD-10-CM

## 2023-03-01 DIAGNOSIS — Z603 Acculturation difficulty: Secondary | ICD-10-CM

## 2023-03-01 DIAGNOSIS — R7989 Other specified abnormal findings of blood chemistry: Secondary | ICD-10-CM

## 2023-03-01 MED ORDER — METHOCARBAMOL 500 MG PO TABS
500.0000 mg | ORAL_TABLET | Freq: Three times a day (TID) | ORAL | 0 refills | Status: DC | PRN
  Filled 2023-03-01: qty 90, 30d supply, fill #0

## 2023-03-01 NOTE — Progress Notes (Signed)
Patient ID: Katherine [name], female   DOB: 01/17/1981, 42 y.o.   MRN: [id number]   Katherine [name], is a 42 y.o. female  WUJ:[id number]  [id number]  DOB - [birth year]  No chief complaint on file.      Subjective:   Katherine [name] is a 42 y.o. female here today for continued trouble with swelling all over her body-esp hands and legs/feet.  She says it is bad all the time.  She does not exercise.  Diet is poor overall.  Almost 20 pound weight gain in the last year.  This is not a new complaint.  Extensive work-up done last years.  Her husband is with her today.  She is also c/o muscle spasms in her neck about 3 days per week.    No problems updated.  ALLERGIES: No Known Allergies  PAST MEDICAL HISTORY: Past Medical History:  Diagnosis Date   Abnormal uterine bleeding    Anemia    Anxiety    Hemorrhoids    Hyperlipidemia     MEDICATIONS AT HOME: Prior to Admission medications   Medication Sig Start Date End Date Taking? Authorizing Provider  methocarbamol (ROBAXIN) 500 MG tablet Take 1 tablet (500 mg total) by mouth every 8 (eight) hours as needed for muscle spasms. 03/01/23  Yes Zorah Backes M, PA-C  naproxen (NAPROSYN) 500 MG tablet Take 1 tablet (500 mg total) by mouth 2 (two) times daily with a meal. Patient not taking: Reported on 03/01/2023 02/23/22   Hoy Register, MD    ROS: Neg HEENT Neg resp Neg cardiac Neg GI Neg GU Neg psych Neg neuro  Objective:   Vitals:   03/01/23 1433  BP: 126/84  Pulse: 70  Temp: 98.5 F (36.9 C)  SpO2: 99%  Weight: 198 lb 12.8 oz (90.2 kg)  Height: 5\' 2"  (1.575 m)   Exam General appearance : Awake, alert, not in any distress. Speech Clear. Not toxic looking HEENT: Atraumatic and Normocephalic Neck: Supple, no JVD. No cervical lymphadenopathy.  Chest: Good air entry bilaterally, CTAB.  No rales/rhonchi/wheezing CVS: S1 S2 regular, no murmurs.  Extremities: B/L Lower Ext shows mild edema, both legs  are warm to touch.  Hands also edematous Neurology: Awake alert, and oriented X 3, CN II-XII intact, Non focal Skin: No Rash  Data Review Lab Results  Component Value Date   HGBA1C 5.8 (H) 02/23/2022   HGBA1C 5.6 03/13/2021   HGBA1C 5.1 11/15/2014    Assessment & Plan   1. Edema, unspecified type Brisk walks 2 times daily at least 10 mins each to improve circulation.  Increase water intake.  Decrease salts and sugars.  Compression stockings for legs - TSH - B12 and Folate Panel Reviewed labs from last year.  No inflammatory markers  2. Vitamin D deficiency - Vitamin D, 25-hydroxy - B12 and Folate Panel  3. Elevated LFTs Decrease fatty foods and alcohol - B12 and Folate Panel - Iron, TIBC and Ferritin Panel  4. Muscle spasm - TSH - methocarbamol (ROBAXIN) 500 MG tablet; Take 1 tablet (500 mg total) by mouth every 8 (eight) hours as needed for muscle spasms.  Dispense: 90 tablet; Refill: 0  5. Other iron deficiency anemia - CBC with Differential/Platelet - B12 and Folate Panel - Iron, TIBC and Ferritin Panel  6. Prediabetes  I have advised the patient to work at a goal of eliminating sugary drinks, candy, desserts, sweets, refined sugars, processed foods, and white carbohydrates.  The patient expresses understanding.   - B12 and  Folate Panel - Hemoglobin A1c    Return in about 4 months (around 07/02/2023) for PCP(Newlin) for chronic conditions.  The patient was given clear instructions to go to ER or return to medical center if symptoms don't improve, worsen or new problems develop. The patient verbalized understanding. The patient was told to call to get lab results if they haven't heard anything in the next week.      Georgian Co, PA-C [facility] [place], [place] [phone]   03/01/2023, 2:58 PM

## 2023-03-01 NOTE — Progress Notes (Signed)
Pt is here for swelling   Complaining of swelling in both hands and feet on and off for X2 years   Complaining of pressure in her head/headache for X1 year Pt states when she has the headache her vision goes blurry

## 2023-03-01 NOTE — Patient Instructions (Addendum)
beba de 80 a 100 onzas de agua al [employer].  Elimina los azcares y los alimentos ricos en almidn de tu dieta. disminuir la ingesta de sodio.  Realice 2 caminatas de diez minutos al da para mejorar la circulacin.  Las medias de compresin ayudarn con la hinchazn de las piernas.   Edema Edema  Un edema se produce cuando hay mucho lquido en el cuerpo o debajo de la piel. Un edema puede hacer que las piernas, los pies y los tobillos se hinchen. La hinchazn suele ocurrir en tejidos ms sueltos, como alrededor [employer]. Esta es una afeccin frecuente. Es ms frecuente a medida que una persona envejece. Hay muchas causas posibles de edema. Estos incluyen: Consumir demasiada sal (sodio). Estar de pie o sentado durante mucho tiempo. Afecciones mdicas tales como: Embarazo. Insuficiencia cardaca. Enfermedad heptica. Enfermedad renal. Cncer. Cuando hace calor, el edema puede empeorar. Generalmente, el edema es indoloro. La piel puede parecer hinchada o tener un aspecto brilloso. Siga estas instrucciones en su casa: Medicamentos Use los medicamentos de venta libre y los recetados solamente como se lo haya indicado el mdico. El mdico puede recetarle un medicamento para ayudar a que el cuerpo elimine el exceso de agua (diurtico). Tome este medicamento si se lo indican. Comida y bebida Lleve una dieta con bajo contenido de sal (baja en sodio) como se lo haya indicado el mdico. A veces, disminuir el consumo de sal puede reducir la hinchazn. Segn la causa de su hinchazn, es posible que deba limitar la cantidad de lquido que bebe (restriccin de lquido). Instrucciones generales Cuando est sentado o acostado, mantenga la zona lesionada por encima del nivel del corazn. No se quede quieto ni permanezca de pie durante [employer]. No use ropa ajustada. No use ligas en la parte superior de las piernas. Ejercite las piernas. Esto ayuda a Building services engineer. Use medias de compresin  como se lo haya indicado el mdico. Es importante que sean del tamao correcto. Estas medias deben ser prescritas por su mdico para evitar posibles lesiones. Si le recomiendan vendajes, selos como se lo haya indicado el mdico. Comunquese con un mdico si: El tratamiento no funciona. Tiene enfermedades cardacas, hepticas o renales, y observa sntomas de edema. Aumenta de peso de Luther repentina y sin motivo aparente. Solicite ayuda de inmediato si: Le falta el aire o le duele el pecho. No puede respirar cuando se acuesta. Tiene dolor, enrojecimiento o calor en las zonas hinchadas. Tiene una enfermedad cardaca, heptica o renal, y se le forma un edema de manera repentina. Tiene fiebre y los sntomas empeoran de manera repentina. Estos sntomas pueden Customer service manager. Solicite ayuda de inmediato. Llame al 911. No espere a ver si los sntomas desaparecen. No conduzca por sus propios medios [employer] [facility]. Resumen Un edema se produce cuando hay mucho lquido en el cuerpo o debajo de la piel. Un edema puede hacer que las piernas, los pies y los tobillos se hinchen. La hinchazn suele ocurrir en tejidos ms sueltos, como alrededor [employer]. Cuando est sentado o acostado, mantenga la zona lesionada por encima del nivel del corazn. Siga las instrucciones del mdico con respecto a la dieta y a la cantidad de lquido que puede beber. Esta informacin no tiene Theme park manager el consejo del mdico. Asegrese de hacerle al mdico cualquier pregunta que tenga. Document Revised: 06/30/2021 Document Reviewed: 06/30/2021 Elsevier Patient Education  2023 [employer].

## 2023-03-03 ENCOUNTER — Other Ambulatory Visit: Payer: Self-pay

## 2023-03-03 ENCOUNTER — Other Ambulatory Visit: Payer: Self-pay | Admitting: Physician Assistant

## 2023-03-03 DIAGNOSIS — E559 Vitamin D deficiency, unspecified: Secondary | ICD-10-CM

## 2023-03-03 LAB — CBC WITH DIFFERENTIAL/PLATELET
Basophils Absolute: 0 10*3/uL (ref 0.0–0.2)
Basos: 0 %
EOS (ABSOLUTE): 0.3 10*3/uL (ref 0.0–0.4)
Eos: 4 %
Hematocrit: 38.5 % (ref 34.0–46.6)
Hemoglobin: 13 g/dL (ref 11.1–15.9)
Immature Grans (Abs): 0 10*3/uL (ref 0.0–0.1)
Immature Granulocytes: 1 %
Lymphocytes Absolute: 2.6 10*3/uL (ref 0.7–3.1)
Lymphs: 37 %
MCH: 31.9 pg (ref 26.6–33.0)
MCHC: 33.8 g/dL (ref 31.5–35.7)
MCV: 94 fL (ref 79–97)
Monocytes Absolute: 0.6 10*3/uL (ref 0.1–0.9)
Monocytes: 9 %
Neutrophils Absolute: 3.5 10*3/uL (ref 1.4–7.0)
Neutrophils: 49 %
Platelets: 215 10*3/uL (ref 150–450)
RBC: 4.08 x10E6/uL (ref 3.77–5.28)
RDW: 12.8 % (ref 11.7–15.4)
WBC: 7.1 10*3/uL (ref 3.4–10.8)

## 2023-03-03 LAB — IRON,TIBC AND FERRITIN PANEL
Ferritin: 22 ng/mL (ref 15–150)
Iron Saturation: 24 % (ref 15–55)
Iron: 89 ug/dL (ref 27–159)
Total Iron Binding Capacity: 368 ug/dL (ref 250–450)
UIBC: 279 ug/dL (ref 131–425)

## 2023-03-03 LAB — HEMOGLOBIN A1C
Est. average glucose Bld gHb Est-mCnc: 114 mg/dL
Hgb A1c MFr Bld: 5.6 % (ref 4.8–5.6)

## 2023-03-03 LAB — VITAMIN D 25 HYDROXY (VIT D DEFICIENCY, FRACTURES): Vit D, 25-Hydroxy: 13.8 ng/mL — ABNORMAL LOW (ref 30.0–100.0)

## 2023-03-03 LAB — B12 AND FOLATE PANEL
Folate: 9.7 ng/mL (ref >3.0)
Vitamin B-12: 470 pg/mL (ref 232–1245)

## 2023-03-03 LAB — TSH: TSH: 3.43 u[IU]/mL (ref 0.450–4.500)

## 2023-03-03 MED ORDER — VITAMIN D (ERGOCALCIFEROL) 1.25 MG (50000 UNIT) PO CAPS
50000.0000 [IU] | ORAL_CAPSULE | ORAL | 0 refills | Status: DC
  Filled 2023-03-03: qty 16, 112d supply, fill #0
  Filled 2023-03-16: qty 12, 84d supply, fill #0

## 2023-03-10 ENCOUNTER — Other Ambulatory Visit: Payer: Self-pay

## 2023-03-12 ENCOUNTER — Ambulatory Visit: Payer: Self-pay | Admitting: *Deleted

## 2023-03-12 ENCOUNTER — Telehealth: Payer: Self-pay | Admitting: *Deleted

## 2023-03-12 NOTE — Telephone Encounter (Signed)
Patient calling for results and notified:  Please call patient and let them that their vitamin D is low.  This can contribute to muscle aches, anxiety, fatigue, and depression.  I have sent a prescription to the pharmacy for them to take once a week.  We will recheck this level in 3-4 months.  Thyroid level is normal.  Anemia and iron are stable.  B12 and folate are normal.  A1C is normal/no diabetes.  Thanks, Georgian Co, PA-C

## 2023-03-12 NOTE — Telephone Encounter (Signed)
  Chief Complaint: Pt called in and was given her lab results from Georgian Co, PA-C Symptoms: N/A Frequency: N/A Pertinent Negatives: Patient denies N/A Disposition: [] ED /[] Urgent Care (no appt availability in office) / [] Appointment(In office/virtual)/ []  [facility]/ [x] Home Care/ [] Refused Recommended Disposition /[] [facility]/ []  Follow-up with PCP Additional Notes:

## 2023-03-12 NOTE — Telephone Encounter (Signed)
Reason for Disposition  [1] Follow-up call to recent contact AND [2] information only call, no triage required  Answer Assessment - Initial Assessment Questions 1. REASON FOR CALL or QUESTION: "What is your reason for calling today?" or "How can I best help you?" or "What question do you have that I can help answer?"     Pt given lab results per notes of Georgian Co, PA-C on 03/12/2023 at 11:07 AM.   Pt verbalized understanding.  Protocols used: Information Only Call - No Triage-A-AH

## 2023-03-16 ENCOUNTER — Other Ambulatory Visit: Payer: Self-pay

## 2023-03-19 ENCOUNTER — Encounter: Payer: Self-pay | Admitting: *Deleted

## 2023-03-26 ENCOUNTER — Telehealth: Payer: Self-pay

## 2023-03-26 NOTE — Telephone Encounter (Signed)
Pt given lab results per notes of A. Muclung PA on 03/26/23. Pt verbalized understanding.

## 2023-05-26 ENCOUNTER — Ambulatory Visit: Payer: Self-pay | Attending: Family Medicine

## 2023-05-26 DIAGNOSIS — Z23 Encounter for immunization: Secondary | ICD-10-CM

## 2023-05-26 NOTE — Progress Notes (Signed)
Hepatitis B recombinant vaccine  LEFT DELTOID administered per protocols.  Information sheet given. Patient denies and pain or discomfort at injection site. Tolerated injection well no reaction.

## 2023-05-27 ENCOUNTER — Ambulatory Visit: Payer: Self-pay

## 2024-01-11 ENCOUNTER — Encounter: Payer: Self-pay | Admitting: Nurse Practitioner

## 2024-01-11 ENCOUNTER — Ambulatory Visit: Payer: Self-pay | Attending: Nurse Practitioner | Admitting: Nurse Practitioner

## 2024-01-11 VITALS — BP 118/83 | HR 74 | Ht 62.0 in | Wt 185.6 lb

## 2024-01-11 DIAGNOSIS — D509 Iron deficiency anemia, unspecified: Secondary | ICD-10-CM

## 2024-01-11 DIAGNOSIS — D229 Melanocytic nevi, unspecified: Secondary | ICD-10-CM

## 2024-01-11 DIAGNOSIS — E559 Vitamin D deficiency, unspecified: Secondary | ICD-10-CM | POA: Diagnosis not present

## 2024-01-11 DIAGNOSIS — R7303 Prediabetes: Secondary | ICD-10-CM

## 2024-01-11 DIAGNOSIS — Z0100 Encounter for examination of eyes and vision without abnormal findings: Secondary | ICD-10-CM

## 2024-01-11 NOTE — Progress Notes (Signed)
 I have seen and examined this patient with the advanced practice provider STUDENT and agree with the note below

## 2024-01-11 NOTE — Progress Notes (Signed)
 Assessment & Plan:  [name] was seen today for fatigue.  Diagnoses and all orders for this visit:  Prediabetes -     Hemoglobin A1c Recommendations: DASH/Mediterranean diet with low carbohydrates, decrease sugary and highly processed foods.    Routine eye exam -     Ambulatory referral to Ophthalmology  Atypical mole -     Ambulatory referral to Dermatology  Vitamin D deficiency -     VITAMIN D 25 Hydroxy (Vit-D Deficiency, Fractures) Exercise 3-5 days per week at least 30 minutes per day   Iron deficiency anemia, unspecified iron deficiency anemia type -     CBC with Differential/Platelet    Patient has been counseled on age-appropriate routine health concerns for screening and prevention. These are reviewed and up-to-date. Referrals have been placed accordingly. Immunizations are up-to-date or declined.    Subjective:   Chief Complaint  Patient presents with   Fatigue    Katherine [name] 43 y.o. female presents to office today for follow up on fatigue. Works full-time from [employer], but unable to perform other  extracurricular activities due to feeling tired and fatigued after work.   Has hx of Vit D deficiency. She took prescription Vit D but did not follow up with Vitamin D supplement OTC. Will recheck Vit D today.  Reports having mole on her LUQ of abdomen that has changed colors. Denies changes in mole size, shape or texture.  Referral will be sent to Dermatology.   Endorses blurred vision. Denies discharge, or pain. Referral sent to Opthalmology for routine eye exam.     Review of Systems  Constitutional:  Positive for malaise/fatigue. Negative for weight loss.  HENT: Negative.    Eyes:  Positive for blurred vision.  Respiratory: Negative.    Cardiovascular: Negative.   Gastrointestinal: Negative.   Genitourinary: Negative.   Musculoskeletal: Negative.   Skin:        Mole LUQ abdomen  Neurological: Negative.   Endo/Heme/Allergies: Negative.    Psychiatric/Behavioral: Negative.      Past Medical History:  Diagnosis Date   Abnormal uterine bleeding    Anemia    Anxiety    Hemorrhoids    Hyperlipidemia     Past Surgical History:  Procedure Laterality Date   HEMORRHOID SURGERY  12/12/2010   Dr Gaynelle Adu; excision of rt anterior mixed column hemorrhoid, rubber band ligation of Rt lateral int hemorrhoid   VAGINAL HYSTERECTOMY N/A 11/19/2021   Procedure: TOTAL HYSTERECTOMY VAGINAL;  Surgeon: Lazaro Arms, MD;  Location: [facility];  Service: Gynecology;  Laterality: N/A;    Family History  Problem Relation Age of Onset   Diabetes Father    Hypertension Mother    Diabetes Sister     Social History Reviewed with no changes to be made today.   Outpatient Medications Prior to Visit  Medication Sig Dispense Refill   methocarbamol (ROBAXIN) 500 MG tablet Take 1 tablet (500 mg total) by mouth every 8 (eight) hours as needed for muscle spasms. (Patient not taking: Reported on 01/11/2024) 90 tablet 0   naproxen (NAPROSYN) 500 MG tablet Take 1 tablet (500 mg total) by mouth 2 (two) times daily with a meal. (Patient not taking: Reported on 01/11/2024) 30 tablet 0   Vitamin D, Ergocalciferol, (DRISDOL) 1.25 MG (50000 UNIT) CAPS capsule Take 1 capsule (50,000 Units total) by mouth every 7 (seven) days. (Patient not taking: Reported on 01/11/2024) 16 capsule 0   No facility-administered medications prior to visit.    No Known  Allergies     Objective:    BP 118/83 (BP Location: Left Arm, Patient Position: Sitting, Cuff Size: Normal)   Pulse 74   Ht 5\' 2"  (1.575 m)   Wt 185 lb 9.6 oz (84.2 kg)   LMP 08/14/2021   SpO2 98%   BMI 33.95 kg/m  Wt Readings from Last 3 Encounters:  01/11/24 185 lb 9.6 oz (84.2 kg)  03/01/23 198 lb 12.8 oz (90.2 kg)  02/23/22 181 lb 3.2 oz (82.2 kg)    Physical Exam Vitals and nursing note reviewed.  Constitutional:      Appearance: Normal appearance.  HENT:     Head: Normocephalic.     Nose:  Nose normal.  Eyes:     General: Lids are normal. Gaze aligned appropriately.     Extraocular Movements: Extraocular movements intact.     Conjunctiva/sclera: Conjunctivae normal.  Cardiovascular:     Rate and Rhythm: Normal rate and regular rhythm.     Heart sounds: Normal heart sounds.  Pulmonary:     Effort: Pulmonary effort is normal.     Breath sounds: Normal breath sounds.  Musculoskeletal:     Cervical back: Full passive range of motion without pain and normal range of motion.  Skin:    General: Skin is warm and dry.          Comments: Mole asymmetrical, irregular borders  Neurological:     General: No focal deficit present.     Mental Status: She is alert and oriented to person, place, and time.  Psychiatric:        Attention and Perception: Attention normal.        Mood and Affect: Mood normal.        Speech: Speech normal.        Behavior: Behavior normal. Behavior is cooperative.        Thought Content: Thought content normal.        Cognition and Memory: Cognition normal.        Judgment: Judgment normal.        Patient has been counseled extensively about nutrition and exercise as well as the importance of adherence with medications and regular follow-up. The patient was given clear instructions to go to ER or return to medical center if symptoms don't improve, worsen or new problems develop. The patient verbalized understanding.   Follow-up: No follow-ups on file.   Joette Catching, BSN RN -student FNP [facility] [place], [place] [phone]   01/11/2024, 5:03 PM

## 2024-01-12 ENCOUNTER — Other Ambulatory Visit: Payer: Self-pay | Admitting: Nurse Practitioner

## 2024-01-12 ENCOUNTER — Other Ambulatory Visit: Payer: Self-pay

## 2024-01-12 DIAGNOSIS — E559 Vitamin D deficiency, unspecified: Secondary | ICD-10-CM

## 2024-01-12 LAB — CBC WITH DIFFERENTIAL/PLATELET
Basophils Absolute: 0 10*3/uL (ref 0.0–0.2)
Basos: 0 %
EOS (ABSOLUTE): 0 10*3/uL (ref 0.0–0.4)
Eos: 0 %
Hematocrit: 39.5 % (ref 34.0–46.6)
Hemoglobin: 13.3 g/dL (ref 11.1–15.9)
Immature Grans (Abs): 0 10*3/uL (ref 0.0–0.1)
Immature Granulocytes: 0 %
Lymphocytes Absolute: 2.8 10*3/uL (ref 0.7–3.1)
Lymphs: 37 %
MCH: 33.1 pg — ABNORMAL HIGH (ref 26.6–33.0)
MCHC: 33.7 g/dL (ref 31.5–35.7)
MCV: 98 fL — ABNORMAL HIGH (ref 79–97)
Monocytes Absolute: 0.6 10*3/uL (ref 0.1–0.9)
Monocytes: 8 %
Neutrophils Absolute: 4 10*3/uL (ref 1.4–7.0)
Neutrophils: 55 %
Platelets: 240 10*3/uL (ref 150–450)
RBC: 4.02 x10E6/uL (ref 3.77–5.28)
RDW: 12.5 % (ref 11.7–15.4)
WBC: 7.4 10*3/uL (ref 3.4–10.8)

## 2024-01-12 LAB — HEMOGLOBIN A1C
Est. average glucose Bld gHb Est-mCnc: 108 mg/dL
Hgb A1c MFr Bld: 5.4 % (ref 4.8–5.6)

## 2024-01-12 LAB — VITAMIN D 25 HYDROXY (VIT D DEFICIENCY, FRACTURES): Vit D, 25-Hydroxy: 12.2 ng/mL — ABNORMAL LOW (ref 30.0–100.0)

## 2024-01-12 MED ORDER — VITAMIN D (ERGOCALCIFEROL) 1.25 MG (50000 UNIT) PO CAPS
50000.0000 [IU] | ORAL_CAPSULE | ORAL | 0 refills | Status: AC
  Filled 2024-01-12: qty 12, 84d supply, fill #0

## 2024-01-14 ENCOUNTER — Other Ambulatory Visit: Payer: Self-pay

## 2024-01-17 ENCOUNTER — Telehealth: Payer: Self-pay | Admitting: *Deleted

## 2024-01-17 NOTE — Telephone Encounter (Signed)
 Referral was placed on 01/11/2024, when patient was in office.     Copied from CRM [id number]. Topic: Referral - Question >> Jan 17, 2024 12:00 PM Katherine [name] wrote: Reason for CRM: Patient calling about referral to Ophthalmology Dr spoke about in her visit last week, she would like to be referred and scheduled for an appt with the eye Dr

## 2024-02-10 ENCOUNTER — Ambulatory Visit: Payer: Self-pay

## 2024-02-10 ENCOUNTER — Encounter (HOSPITAL_COMMUNITY): Payer: Self-pay

## 2024-02-10 ENCOUNTER — Ambulatory Visit (HOSPITAL_COMMUNITY)
Admission: RE | Admit: 2024-02-10 | Discharge: 2024-02-10 | Disposition: A | Discharge status: Home / Self Care | Source: Ambulatory Visit | Attending: Internal Medicine | Admitting: Internal Medicine

## 2024-02-10 ENCOUNTER — Ambulatory Visit (INDEPENDENT_AMBULATORY_CARE_PROVIDER_SITE_OTHER)

## 2024-02-10 ENCOUNTER — Other Ambulatory Visit: Payer: Self-pay

## 2024-02-10 VITALS — BP 134/86 | HR 88 | Temp 98.8°F | Resp 18

## 2024-02-10 DIAGNOSIS — R051 Acute cough: Secondary | ICD-10-CM

## 2024-02-10 DIAGNOSIS — J4521 Mild intermittent asthma with (acute) exacerbation: Secondary | ICD-10-CM

## 2024-02-10 MED ORDER — IPRATROPIUM-ALBUTEROL 0.5-2.5 (3) MG/3ML IN SOLN
3.0000 mL | Freq: Once | RESPIRATORY_TRACT | Status: AC
  Administered 2024-02-10: 3 mL via RESPIRATORY_TRACT

## 2024-02-10 MED ORDER — IPRATROPIUM-ALBUTEROL 0.5-2.5 (3) MG/3ML IN SOLN
RESPIRATORY_TRACT | Status: AC
  Filled 2024-02-10: qty 3

## 2024-02-10 MED ORDER — ALBUTEROL SULFATE HFA 108 (90 BASE) MCG/ACT IN AERS: 1.0000 | INHALATION_SPRAY | Freq: Four times a day (QID) | RESPIRATORY_TRACT | 0 refills | Status: DC | PRN

## 2024-02-10 MED ORDER — PREDNISONE 20 MG PO TABS: 40.0000 mg | ORAL_TABLET | Freq: Every day | ORAL | 0 refills | Status: AC

## 2024-02-10 MED ORDER — ALBUTEROL SULFATE (2.5 MG/3ML) 0.083% IN NEBU: 2.5000 mg | INHALATION_SOLUTION | Freq: Four times a day (QID) | RESPIRATORY_TRACT | 1 refills | Status: AC | PRN

## 2024-02-10 NOTE — ED Triage Notes (Signed)
 PT reports she has a cough and has asthma.

## 2024-02-10 NOTE — ED Provider Notes (Signed)
 [facility]    CSN: [id number] Arrival date & time: 02/10/24  1735      History   Chief Complaint Chief Complaint  Patient presents with   Cough    Entered by patient    HPI Katherine [name] is a 43 y.o. female.   43 year old female who presents to urgent care with complaints of coughing, wheezing and shortness of breath.  She has had a cough for about a week.  This is resulted in her having increased shortness of breath and wheezing.  She does have asthma but since she has been here she has not had any medication for it.  She was using inhalers and other medications while she was in [place].  She denies any fevers.  She has chills but these are at baseline for her.  She denies any nausea or vomiting.  She has a primary care physician but when they contacted them today they were unable to see her urgently.   Cough Associated symptoms: shortness of breath and wheezing   Associated symptoms: no chest pain, no chills, no ear pain, no fever, no rash and no sore throat     Past Medical History:  Diagnosis Date   Abnormal uterine bleeding    Anemia    Anxiety    Hemorrhoids    Hyperlipidemia     Patient Active Problem List   Diagnosis Date Noted   Abnormal uterine bleeding (AUB) 12/17/2016   Endometrial thickening on ultrasound 09/17/2016   Chronic breast pain 08/10/2016   Irregular menses 08/10/2016   Vitamin D  deficiency 08/10/2016   External hemorrhoids 11/26/2010   HYPERCHOLESTEROLEMIA 06/11/2009   IDA (iron deficiency anemia) 05/28/2009    Past Surgical History:  Procedure Laterality Date   HEMORRHOID SURGERY  12/12/2010   Dr Aldean Hummingbird; excision of rt anterior mixed column hemorrhoid, rubber band ligation of Rt lateral int hemorrhoid   VAGINAL HYSTERECTOMY N/A 11/19/2021   Procedure: TOTAL HYSTERECTOMY VAGINAL;  Surgeon: Wendelyn Halter, MD;  Location: [facility];  Service: Gynecology;  Laterality: N/A;    OB History     Gravida  5   Para  4    Term  3   Preterm  1   AB  1   Living  3      SAB  1   IAB      Ectopic      Multiple      Live Births               Home Medications    Prior to Admission medications   Medication Sig Start Date End Date Taking? Authorizing Provider  methocarbamol  (ROBAXIN ) 500 MG tablet Take 1 tablet (500 mg total) by mouth every 8 (eight) hours as needed for muscle spasms. Patient not taking: Reported on 01/11/2024 03/01/23   Hassie Lint, PA-C  naproxen  (NAPROSYN ) 500 MG tablet Take 1 tablet (500 mg total) by mouth 2 (two) times daily with a meal. Patient not taking: Reported on 03/01/2023 02/23/22   Newlin, Enobong, MD  Vitamin D , Ergocalciferol , (DRISDOL ) 1.25 MG (50000 UNIT) CAPS capsule Take 1 capsule (50,000 Units total) by mouth once a week. 01/12/24   Collins Dean, NP    Family History Family History  Problem Relation Age of Onset   Diabetes Father    Hypertension Mother    Diabetes Sister     Social History Social History   Tobacco Use   Smoking status: Never   Smokeless tobacco:  Never  Vaping Use   Vaping status: Never Used  Substance Use Topics   Alcohol use: No   Drug use: No     Allergies   Patient has no known allergies.   Review of Systems Review of Systems  Constitutional:  Negative for chills and fever.  HENT:  Negative for ear pain and sore throat.   Eyes:  Negative for pain and visual disturbance.  Respiratory:  Positive for cough, shortness of breath and wheezing.   Cardiovascular:  Negative for chest pain and palpitations.  Gastrointestinal:  Negative for abdominal pain and vomiting.  Genitourinary:  Negative for dysuria and hematuria.  Musculoskeletal:  Negative for arthralgias and back pain.  Skin:  Negative for color change and rash.  Neurological:  Negative for seizures and syncope.  All other systems reviewed and are negative.    Physical Exam Triage Vital Signs ED Triage Vitals  Encounter Vitals Group     BP 02/10/24  1815 134/86     Systolic BP Percentile --      Diastolic BP Percentile --      Pulse Rate 02/10/24 1815 88     Resp 02/10/24 1815 18     Temp 02/10/24 1815 98.8 F (37.1 C)     Temp src --      SpO2 02/10/24 1815 92 %     Weight --      Height --      Head Circumference --      Peak Flow --      Pain Score 02/10/24 1813 6     Pain Loc --      Pain Education --      Exclude from Growth Chart --    No data found.  Updated Vital Signs BP 134/86   Pulse 88   Temp 98.8 F (37.1 C)   Resp 18   LMP 08/14/2021   SpO2 92%   Visual Acuity Right Eye Distance:   Left Eye Distance:   Bilateral Distance:    Right Eye Near:   Left Eye Near:    Bilateral Near:     Physical Exam Vitals and nursing note reviewed.  Constitutional:      General: She is not in acute distress.    Appearance: She is well-developed.  HENT:     Head: Normocephalic and atraumatic.  Eyes:     Conjunctiva/sclera: Conjunctivae normal.  Cardiovascular:     Rate and Rhythm: Normal rate and regular rhythm.     Heart sounds: No murmur heard. Pulmonary:     Effort: Pulmonary effort is normal. No respiratory distress.     Breath sounds: Examination of the right-upper field reveals wheezing. Examination of the left-upper field reveals wheezing. Examination of the right-middle field reveals wheezing. Examination of the left-middle field reveals wheezing. Examination of the right-lower field reveals wheezing. Examination of the left-lower field reveals wheezing. Wheezing present. No decreased breath sounds.  Abdominal:     Palpations: Abdomen is soft.     Tenderness: There is no abdominal tenderness.  Musculoskeletal:        General: No swelling.     Cervical back: Neck supple.  Skin:    General: Skin is warm and dry.     Capillary Refill: Capillary refill takes less than 2 seconds.  Neurological:     Mental Status: She is alert.  Psychiatric:        Mood and Affect: Mood normal.     UC Treatments /  Results  Labs (all labs ordered are listed, but only abnormal results are displayed) Labs Reviewed - No data to display  EKG   Radiology No results found.  Procedures Procedures (including critical care time)  Medications Ordered in UC Medications  ipratropium-albuterol  (DUONEB) 0.5-2.5 (3) MG/3ML nebulizer solution 3 mL (3 mLs Nebulization Given 02/10/24 1846)    Initial Impression / Assessment and Plan / UC Course  I have reviewed the triage vital signs and the nursing notes.  Pertinent labs & imaging results that were available during my care of the patient were reviewed by me and considered in my medical decision making (see chart for details).     Acute cough - Plan: DG Chest 2 View, DG Chest 2 View  Mild intermittent asthma with exacerbation   Chest x-ray done today due to shortness of breath and wheezing.  On brief evaluation there does not appear to be any acute findings however radiology evaluation is still pending.  If there are any changes to the treatment plan after radiology has read the x-ray then we will contact you.  Symptoms are most consistent with asthma exacerbation with possible viral upper respiratory infection.  We will treat with the following: DuoNeb breathing treatment done in clinic today. Prednisone  40 mg (2 tablets) once daily for 5 days. Take this in the morning.  This is a steroid to help with inflammation and pain.  Albuterol  inhaler 1-2 puffs every 6 hours as needed for wheezing/shortness of breath. Albuterol  nebulizing treatments every 6 hours as needed for severe shortness of breath or wheezing Make an appointment with your primary care physician to discuss your asthma and to get further recommendations for treatment Rest and stay hydrated.  Drink plenty of fluids. Return to urgent care or PCP if symptoms worsen or fail to resolve.    Final Clinical Impressions(s) / UC Diagnoses   Final diagnoses:  Acute cough   Discharge Instructions    None    ED Prescriptions   None    PDMP not reviewed this encounter.   Kreg Pesa, [place] 02/10/24 1941

## 2024-02-10 NOTE — ED Triage Notes (Signed)
 PT's DOB and Full name verified at statr of triage.

## 2024-02-10 NOTE — Telephone Encounter (Signed)
  Chief Complaint: Shortness of breath Symptoms: Cough, runny nose, shortness of breath Frequency: Sunday Pertinent Negatives: Patient denies CP, fever Disposition: [] ED /[x] Urgent Care (no appt availability in office) / [] Appointment(In office/virtual)/ []  [facility]/ [] Home Care/ [] Refused Recommended Disposition /[] [facility]/ []  Follow-up with PCP Additional Notes: patient called with concerns for shortness of breath with cough and runny nose. Patient states this started on Sunday. Endorses past hx of asthma but it has been many years. Patient endorses she is wanting an inhaler. Per protocol, patient is recommended to be seen today. Urgent care was given as the recommendation as no availability in PCP. Patient requested an appointment at [facility]. Appointment made at one of [facility] Urgent Care locations for today. Patient verbalized understanding of plan and all question answered.    Copied from CRM [id number]. Topic: Clinical - Red Word Triage >> Feb 10, 2024 11:47 AM Turkey B wrote: [facility] that prompted transfer to Nurse Triage: pt has trouble breathing with asthma Reason for Disposition  [1] MILD difficulty breathing (e.g., minimal/no SOB at rest, SOB with walking, pulse <100) AND [2] NEW-onset or WORSE than normal  Answer Assessment - Initial Assessment Questions 1. RESPIRATORY STATUS: "Describe your breathing?" (e.g., wheezing, shortness of breath, unable to speak, severe coughing)      Wheezing, shortness of breath 2. ONSET: "When did this breathing problem begin?"      Sunday 3. PATTERN "Does the difficult breathing come and go, or has it been constant since it started?"      constant 4. SEVERITY: "How bad is your breathing?" (e.g., mild, moderate, severe)    - MILD: No SOB at rest, mild SOB with walking, speaks normally in sentences, can lie down, no retractions, pulse < 100.    - MODERATE: SOB at rest, SOB with minimal exertion and prefers to sit, cannot  lie down flat, speaks in phrases, mild retractions, audible wheezing, pulse 100-120.    - SEVERE: Very SOB at rest, speaks in single words, struggling to breathe, sitting hunched forward, retractions, pulse > 120      Mild 5. RECURRENT SYMPTOM: "Have you had difficulty breathing before?" If Yes, ask: "When was the last time?" and "What happened that time?"      Yes but a long time ago 6. CARDIAC HISTORY: "Do you have any history of heart disease?" (e.g., heart attack, angina, bypass surgery, angioplasty)      no 7. LUNG HISTORY: "Do you have any history of lung disease?"  (e.g., pulmonary embolus, asthma, emphysema)     no 8. CAUSE: "What do you think is causing the breathing problem?"      Possible asthma 9. OTHER SYMPTOMS: "Do you have any other symptoms? (e.g., dizziness, runny nose, cough, chest pain, fever)     Runny nose, cough 10. O2 SATURATION MONITOR:  "Do you use an oxygen saturation monitor (pulse oximeter) at home?" If Yes, ask: "What is your reading (oxygen level) today?" "What is your usual oxygen saturation reading?" (e.g., 95%)       N/A 11. PREGNANCY: "Is there any chance you are pregnant?" "When was your last menstrual period?"       no 12. TRAVEL: "Have you traveled out of the country in the last month?" (e.g., travel history, exposures)       no  Protocols used: Breathing Difficulty-A-AH

## 2024-02-10 NOTE — Telephone Encounter (Signed)
 2nd attempt, Interpreter Claudis Cumber #[id number] called pt, left VM to return the call to the office to speak to NT.

## 2024-02-10 NOTE — Telephone Encounter (Signed)
 Per epic patient has appointment scheduled for Urgent this afternoon.

## 2024-02-10 NOTE — Discharge Instructions (Addendum)
 Chest x-ray done today due to shortness of breath and wheezing.  On brief evaluation there does not appear to be any acute findings however radiology evaluation is still pending.  If there are any changes to the treatment plan after radiology has read the x-ray then we will contact you.  Symptoms are most consistent with asthma exacerbation with possible viral upper respiratory infection.  We will treat with the following: DuoNeb breathing treatment done in clinic today. Prednisone  40 mg (2 tablets) once daily for 5 days. Take this in the morning.  This is a steroid to help with inflammation and pain.  Albuterol  inhaler 1-2 puffs every 6 hours as needed for wheezing/shortness of breath. Albuterol  nebulizing treatments every 6 hours as needed for severe shortness of breath or wheezing Make an appointment with your primary care physician to discuss your asthma and to get further recommendations for treatment Rest and stay hydrated.  Drink plenty of fluids. Return to urgent care or PCP if symptoms worsen or fail to resolve.

## 2024-02-10 NOTE — Telephone Encounter (Signed)
 3rd attempt, Katherine [name] #[id number] Patient called, left VM to return the call to the office to speak to NT.  Unable to reach patient after 3 attempts by E2C2 NT, routing to the provider for resolution per protocol.

## 2024-02-10 NOTE — Telephone Encounter (Signed)
  1st attempt- [employer] Interpreter [phone] used to contact patient for triage. Received VMB. Interpreter left VM to call back. Will continue to attempt.  Copied from CRM [id number]. Topic: Clinical - Red Word Triage >> Feb 10, 2024  8:22 AM Loreda Rodriguez T wrote: [facility] that prompted transfer to Nurse Triage:  patients spouse called stated patient is suffering from asthma as she is having a hard time breathing along with back pain. Spouse doesn't need to speak with NT as he wants someone to call his wife at [phone] who wanted an appt for provider to prescribe medication and an inhaler for her. Please f/u with patient using Spanish interpreter.

## 2024-02-10 NOTE — Telephone Encounter (Signed)
 noted

## 2024-02-16 ENCOUNTER — Encounter: Payer: Self-pay | Admitting: Physician Assistant

## 2024-02-28 ENCOUNTER — Other Ambulatory Visit: Payer: Self-pay | Admitting: Family Medicine

## 2024-02-28 ENCOUNTER — Other Ambulatory Visit: Payer: Self-pay

## 2024-02-28 ENCOUNTER — Telehealth: Payer: Self-pay | Admitting: Family Medicine

## 2024-02-28 MED ORDER — ALBUTEROL SULFATE HFA 108 (90 BASE) MCG/ACT IN AERS
1.0000 | INHALATION_SPRAY | Freq: Four times a day (QID) | RESPIRATORY_TRACT | 2 refills | Status: AC | PRN
  Filled 2024-02-28: qty 6.7, 30d supply, fill #0
  Filled 2024-02-28 – 2024-02-29 (×2): qty 6.7, 25d supply, fill #0

## 2024-02-28 NOTE — Telephone Encounter (Signed)
 Copied from CRM [id number]. Topic: Referral - Question >> Feb 28, 2024  1:48 PM Phil Braun wrote:  Reason for CRM:   Nancyann Aye with [facility] clinic, [phone]  She is needing the chart notes for this patient

## 2024-02-29 ENCOUNTER — Other Ambulatory Visit: Payer: Self-pay

## 2024-02-29 NOTE — Telephone Encounter (Signed)
 Chart notes has been faxed to [facility]

## 2024-03-15 ENCOUNTER — Encounter (HOSPITAL_COMMUNITY): Payer: Self-pay

## 2024-03-15 ENCOUNTER — Ambulatory Visit (HOSPITAL_COMMUNITY)
Admission: EM | Admit: 2024-03-15 | Discharge: 2024-03-15 | Disposition: A | Discharge status: Home / Self Care | Attending: Family Medicine | Admitting: Family Medicine

## 2024-03-15 DIAGNOSIS — M5442 Lumbago with sciatica, left side: Secondary | ICD-10-CM | POA: Diagnosis not present

## 2024-03-15 MED ORDER — DEXAMETHASONE SODIUM PHOSPHATE 10 MG/ML IJ SOLN
10.0000 mg | Freq: Once | INTRAMUSCULAR | Status: AC
  Administered 2024-03-15: 10 mg via INTRAMUSCULAR

## 2024-03-15 MED ORDER — DEXAMETHASONE SODIUM PHOSPHATE 10 MG/ML IJ SOLN
INTRAMUSCULAR | Status: AC
Start: 2024-03-15 — End: ?
  Filled 2024-03-15: qty 1

## 2024-03-15 MED ORDER — KETOROLAC TROMETHAMINE 30 MG/ML IJ SOLN
30.0000 mg | Freq: Once | INTRAMUSCULAR | Status: AC
  Administered 2024-03-15: 30 mg via INTRAMUSCULAR

## 2024-03-15 MED ORDER — KETOROLAC TROMETHAMINE 30 MG/ML IJ SOLN
INTRAMUSCULAR | Status: AC
  Filled 2024-03-15: qty 1

## 2024-03-15 NOTE — ED Triage Notes (Signed)
 Patient here today with c/o left side LB pain that radiates down her left leg X 3 weeks. Patient states that she sometimes sees a Land. She has also taken Ibuprofen  with little relief.

## 2024-03-15 NOTE — Discharge Instructions (Signed)
Meds ordered this encounter  Medications   dexamethasone (DECADRON) injection 10 mg   ketorolac (TORADOL) 30 MG/ML injection 30 mg

## 2024-03-18 NOTE — ED Provider Notes (Signed)
 [facility]   [id number] 03/15/24 Arrival Time: 1839  ASSESSMENT & PLAN:  1. Acute left-sided low back pain with left-sided sciatica    Able to ambulate here and hemodynamically stable. No indication for imaging of back at this time given no trauma and normal neurological exam. Discussed.  Trial of: Meds ordered this encounter  Medications   dexamethasone  (DECADRON ) injection 10 mg   ketorolac  (TORADOL ) 30 MG/ML injection 30 mg   Work/school excuse note: provided. Encourage ROM/movement as tolerated.  Recommend:  Follow-up Information     Schedule an appointment as soon as possible for a visit  with Newlin, Enobong, MD.   Specialty: Family Medicine Why: For followup. Contact information: [address] [place] [address] [place] [address] [phone]                 Reviewed expectations re: course of current medical issues. Questions answered. Outlined signs and symptoms indicating need for more acute intervention. Patient verbalized understanding. After Visit Summary given.   SUBJECTIVE: History from: patient.  Katherine [name] is a 43 y.o. female who presents with complaint of c/o left side LBP that radiates down her left ; on/off X 3 weeks. Patient states that she sometimes sees a Land. She has also taken Ibuprofen  with little relief. Denies bowel/bladder habit pains. Denies back/hip trauma. Is ambulatory. OTC analgesics without relief. Denies extremity sensation changes or weakness.    OBJECTIVE:  Vitals:   03/15/24 1940  BP: 128/84  Pulse: 76  Resp: 16  Temp: 98.7 F (37.1 C)  TempSrc: Oral  SpO2: 97%    General appearance: alert; no distress HEENT: [place]; AT Neck: supple with FROM; without midline tenderness CV: regular Lungs: unlabored respirations; speaks full sentences without difficulty Back: no specific tenderness to palpation; FROM at waist; bruising: none; without midline tenderness Extremities: without edema;  symmetrical without gross deformities; normal ROM of bilateral LE Skin: warm and dry Neurologic: normal gait; normal sensation and strength of bilateral LE Psychological: alert and cooperative; normal mood and affect   No Known Allergies  Past Medical History:  Diagnosis Date   Abnormal uterine bleeding    Anemia    Anxiety    Hemorrhoids    Hyperlipidemia    Social History   Socioeconomic History   Marital status: Married    Spouse name: Not on file   Number of children: Not on file   Years of education: Not on file   Highest education level: Not on file  Occupational History   Not on file  Tobacco Use   Smoking status: Never   Smokeless tobacco: Never  Vaping Use   Vaping status: Never Used  Substance and Sexual Activity   Alcohol use: Yes    Comment: Rare   Drug use: No   Sexual activity: Yes    Birth control/protection: Pill  Other Topics Concern   Not on file  Social History Narrative   Not on file   Social Drivers of Health   Financial Resource Strain: Low Risk  (08/19/2020)   Overall Financial Resource Strain (CARDIA)    Difficulty of Paying Living Expenses: Not hard at all  Food Insecurity: No Food Insecurity (07/24/2021)   Hunger Vital Sign    Worried About Radiation protection practitioner of Food in the Last Year: Never true    Ran Out of Food in the Last Year: Never true  Transportation Needs: No Transportation Needs (07/24/2021)   PRAPARE - Administrator, Civil Service (Medical):  No    Lack of Transportation (Non-Medical): No  Physical Activity: Insufficiently Active (08/19/2020)   Exercise Vital Sign    Days of Exercise per Week: 1 day    Minutes of Exercise per Session: 50 min  Stress: No Stress Concern Present (08/19/2020)   [employer] of Occupational Health - Occupational Stress Questionnaire    Feeling of Stress : Only a little  Social Connections: Socially Isolated (08/19/2020)   Social Connection and Isolation Panel [NHANES]    Frequency of  Communication with Friends and Family: Once a week    Frequency of Social Gatherings with Friends and Family: Never    Attends Religious Services: Never    Database administrator or Organizations: No    Attends Banker Meetings: Never    Marital Status: Married  Catering manager Violence: Not At Risk (08/19/2020)   Humiliation, Afraid, Rape, and Kick questionnaire    Fear of Current or Ex-Partner: No    Emotionally Abused: No    Physically Abused: No    Sexually Abused: No   Family History  Problem Relation Age of Onset   Diabetes Father    Hypertension Mother    Diabetes Sister    Past Surgical History:  Procedure Laterality Date   HEMORRHOID SURGERY  12/12/2010   Dr Aldean Hummingbird; excision of rt anterior mixed column hemorrhoid, rubber band ligation of Rt lateral int hemorrhoid   VAGINAL HYSTERECTOMY N/A 11/19/2021   Procedure: TOTAL HYSTERECTOMY VAGINAL;  Surgeon: Wendelyn Halter, MD;  Location: [facility];  Service: Gynecology;  Laterality: N/AAfton Albright, MD 03/18/24 [phone]

## 2024-03-23 ENCOUNTER — Emergency Department (HOSPITAL_COMMUNITY)
Admission: EM | Admit: 2024-03-23 | Discharge: 2024-03-24 | Disposition: A | Discharge status: Home / Self Care | Attending: Emergency Medicine | Admitting: Emergency Medicine

## 2024-03-23 ENCOUNTER — Ambulatory Visit: Payer: Self-pay

## 2024-03-23 ENCOUNTER — Other Ambulatory Visit: Payer: Self-pay

## 2024-03-23 ENCOUNTER — Telehealth: Payer: Self-pay

## 2024-03-23 DIAGNOSIS — M5416 Radiculopathy, lumbar region: Secondary | ICD-10-CM | POA: Insufficient documentation

## 2024-03-23 DIAGNOSIS — M545 Low back pain, unspecified: Secondary | ICD-10-CM | POA: Diagnosis present

## 2024-03-23 NOTE — Telephone Encounter (Signed)
 Please reach out to patient to schedule

## 2024-03-23 NOTE — Telephone Encounter (Signed)
 FYI Only or Action Required?: Action required by provider  Patient was last seen in primary care on 01/11/2024 by Collins Dean, NP. Called Nurse Triage reporting Back Pain. Symptoms began several weeks ago. Interventions attempted: Other: Patient has been to chiropractor, has also been to urgent care. Symptoms are: gradually worsening.  Triage Disposition: See PCP When Office is Open (Within 3 Days)  Patient/caregiver understands and will follow disposition?: No, wishes to speak with PCP      Copied from CRM [id number]. Topic: Clinical - Red Word Triage >> Mar 23, 2024  5:03 PM Ethelle Herb L wrote: Red Word that prompted transfer to Nurse Triage: pt in a lot of pain on left leg from bottom of spine to foot  *Patient asking to be called at [phone] if she can be fit in asap for her back pain. Patient also asking if she can be contacted if she can have a referral or order sent for MRI.      Reason for Disposition  [1] MODERATE pain (e.g., interferes with normal activities, limping) AND [2] present > 3 days    No availability - added to wait list  Answer Assessment - Initial Assessment Questions 1. LOCATION and RADIATION: "Where is the pain located?"      From lower back to feet on left side 2. QUALITY: "What does the pain feel like?"  (e.g., sharp, dull, aching, burning)     Patient unable to explain 3. SEVERITY: "How bad is the pain?" "What does it keep you from doing?"   (Scale 1-10; or mild, moderate, severe)   -  MILD (1-3): doesn't interfere with normal activities    -  MODERATE (4-7): interferes with normal activities (e.g., work or school) or awakens from sleep, limping    -  SEVERE (8-10): excruciating pain, unable to do any normal activities, unable to walk     8-9 4. ONSET: "When did the pain start?" "Does it come and go, or is it there all the time?"     May 14th, 2025 5. WORK OR EXERCISE: "Has there been any recent work or exercise that involved this part of the body?"       Yes heavy lifting at work  6. CAUSE: "What do you think is causing the hip pain?"      Injury that occurred at work - patient was lifting heavy boxes 8. OTHER SYMPTOMS: "Do you have any other symptoms?" (e.g., back pain, pain shooting down leg,  fever, rash)     Pain down left leg  Denies rash, burning with urination  Protocols used: Hip Pain-A-AH

## 2024-03-23 NOTE — Telephone Encounter (Signed)
 Patients husband came in stating she had a recent injury at work on 5/14 and she went to [facility] on 5/28. Husband is stating that patient is still complaining of pain on L side of hip and down to her leg she goes to a regular chiropractor and requested a MRI but they told her she needed an order from her PCP first, advised patients husband that she may need any appointment. Patients husband is requesting a callback [phone]

## 2024-03-23 NOTE — ED Triage Notes (Addendum)
 Patient reports persistent pain at entire left leg injured last May 14,2025 at work , she stepped on a crack on the floor and twisted her left ankle . Requesting MRI .

## 2024-03-24 ENCOUNTER — Encounter (HOSPITAL_COMMUNITY): Payer: Self-pay

## 2024-03-24 ENCOUNTER — Emergency Department (HOSPITAL_COMMUNITY)

## 2024-03-24 MED ORDER — OXYCODONE-ACETAMINOPHEN 5-325 MG PO TABS
2.0000 | ORAL_TABLET | Freq: Once | ORAL | Status: AC
  Administered 2024-03-24: 2 via ORAL
  Filled 2024-03-24: qty 2

## 2024-03-24 MED ORDER — PREDNISONE 20 MG PO TABS: 40.0000 mg | ORAL_TABLET | Freq: Every day | ORAL | 0 refills | Status: AC

## 2024-03-24 NOTE — Telephone Encounter (Signed)
 Noted.  Message sent to front office to reach out and schedule patient.

## 2024-03-24 NOTE — ED Notes (Signed)
 Pt care taken, complaining of left ankle pain.

## 2024-03-24 NOTE — ED Provider Notes (Signed)
 [facility] Emergency Department Provider Note MRN:  [id number]  Arrival date & time: 03/24/24     Chief Complaint   Left Leg Pain    History of Present Illness   Katherine [name] is a 43 y.o. year-old female presents to the ED with chief complaint of left hip and back pain.  Also reports left ankle pain.  STates that she had a fall a couple of weeks ago in which she twisted her ankle and fell to the ground.  She states that she has been seen by PT, chiropractics, and urgent care.  Was told she needed an MRI.  She denies any bowel or bladder abnormality.  Denies any fevers or chills.  She states that the pain is burning, stabbing pain in the left buttock.  History provided by patient.   Review of Systems  Pertinent positive and negative review of systems noted in HPI.    Physical Exam   Vitals:   03/24/24 0155 03/24/24 0157  BP: (!) 153/88   Pulse: 82   Resp: 18   Temp: 98.1 F (36.7 C)   SpO2: 99% 99%    CONSTITUTIONAL:  non toxic-appearing, NAD NEURO:  Alert and oriented x 3, CN 3-12 grossly intact EYES:  eyes equal and reactive ENT/NECK:  Supple, no stridor  CARDIO:  normal rate, regular rhythm, appears well-perfused  PULM:  No respiratory distress, CTAB GI/GU:  non-distended,  MSK/SPINE:  No gross deformities, no edema, moves all extremities, no lumbar spine tenderness  SKIN:  no rash, atraumatic   *Additional and/or pertinent findings included in MDM below  Diagnostic and Interventional Summary    EKG Interpretation Date/Time:    Ventricular Rate:    PR Interval:    QRS Duration:    QT Interval:    QTC Calculation:   R Axis:      Text Interpretation:         Labs Reviewed - No data to display  DG Lumbar Spine Complete  Final Result    DG Ankle Complete Left  Final Result      Medications  oxyCODONE -acetaminophen  (PERCOCET/ROXICET) 5-325 MG per tablet 2 tablet (2 tablets Oral Given 03/24/24 0254)     Procedures   /  Critical Care Procedures  ED Course and Medical Decision Making  I have reviewed the triage vital signs, the nursing notes, and pertinent available records from the EMR.  Social Determinants Affecting Complexity of Care: Patient has no clinically significant social determinants affecting this chief complaint..   ED Course:    Medical Decision Making Patient her with low back pain that radiates down the left leg.  Onset about 3 weeks ago after a fall.  She's been seen by PT, chiropracter, urgent care, and ortho.  She is here requesting an MRI.  She had a telephone communication with her PCP office about getting and MRI, but was told that she needed to have an appointment.  It appears that they are working on this.  I don't think that the patient needs emergent MRI due to no cauda equina symptoms.  Her symptoms seem very consistent with lumbar radiculopathy.  I think she'd benefit from some prednisone  and have advised her to continue to follow-up with PCP/ortho.    X-rays of low back and left ankle are reassuring.    Amount and/or Complexity of Data Reviewed Radiology: ordered.  Risk Prescription drug management.         Consultants: No consultations were needed in caring for  this patient.   Treatment and Plan: I considered admission due to patient's initial presentation, but after considering the examination and diagnostic results, patient will not require admission and can be discharged with outpatient follow-up.    Final Clinical Impressions(s) / ED Diagnoses     ICD-10-CM   1. Lumbar radiculopathy  M54.16       ED Discharge Orders          Ordered    predniSONE  (DELTASONE ) 20 MG tablet  Daily        03/24/24 0310              Discharge Instructions Discussed with and Provided to Patient:   Discharge Instructions   None      Sherel Dikes, PA-C 03/24/24 1610    Eldon Greenland, MD 03/24/24 [phone]

## 2024-03-27 NOTE — Telephone Encounter (Signed)
 Noted

## 2024-04-03 ENCOUNTER — Other Ambulatory Visit: Payer: Self-pay

## 2024-04-10 ENCOUNTER — Ambulatory Visit (INDEPENDENT_AMBULATORY_CARE_PROVIDER_SITE_OTHER): Payer: Self-pay | Admitting: Primary Care
